# Patient Record
Sex: Female | Born: 1960 | Race: White | Hispanic: No | Marital: Married | Smoking: Never smoker
Health system: Southern US, Community
[De-identification: ages and names within clinical notes are randomized; demographics above are authoritative.]

## PROBLEM LIST (undated history)

## (undated) DIAGNOSIS — E119 Type 2 diabetes mellitus without complications: Secondary | ICD-10-CM

## (undated) DIAGNOSIS — F419 Anxiety disorder, unspecified: Secondary | ICD-10-CM

## (undated) DIAGNOSIS — K222 Esophageal obstruction: Secondary | ICD-10-CM

## (undated) DIAGNOSIS — N200 Calculus of kidney: Secondary | ICD-10-CM

## (undated) DIAGNOSIS — N3289 Other specified disorders of bladder: Secondary | ICD-10-CM

## (undated) DIAGNOSIS — I10 Essential (primary) hypertension: Secondary | ICD-10-CM

## (undated) DIAGNOSIS — J189 Pneumonia, unspecified organism: Secondary | ICD-10-CM

## (undated) DIAGNOSIS — F32A Depression, unspecified: Secondary | ICD-10-CM

## (undated) DIAGNOSIS — K219 Gastro-esophageal reflux disease without esophagitis: Secondary | ICD-10-CM

## (undated) DIAGNOSIS — F329 Major depressive disorder, single episode, unspecified: Secondary | ICD-10-CM

## (undated) DIAGNOSIS — J329 Chronic sinusitis, unspecified: Secondary | ICD-10-CM

## (undated) DIAGNOSIS — E785 Hyperlipidemia, unspecified: Secondary | ICD-10-CM

## (undated) DIAGNOSIS — K635 Polyp of colon: Secondary | ICD-10-CM

## (undated) DIAGNOSIS — K2270 Barrett's esophagus without dysplasia: Secondary | ICD-10-CM

## (undated) HISTORY — PX: CHOLECYSTECTOMY: SHX55

## (undated) HISTORY — DX: Esophageal obstruction: K22.2

## (undated) HISTORY — DX: Gastro-esophageal reflux disease without esophagitis: K21.9

## (undated) HISTORY — DX: Pneumonia, unspecified organism: J18.9

## (undated) HISTORY — DX: Calculus of kidney: N20.0

## (undated) HISTORY — DX: Chronic sinusitis, unspecified: J32.9

## (undated) HISTORY — DX: Polyp of colon: K63.5

## (undated) HISTORY — DX: Other specified disorders of bladder: N32.89

## (undated) HISTORY — DX: Anxiety disorder, unspecified: F41.9

## (undated) HISTORY — PX: APPENDECTOMY: SHX54

## (undated) HISTORY — PX: TONSILLECTOMY: SUR1361

## (undated) HISTORY — DX: Major depressive disorder, single episode, unspecified: F32.9

## (undated) HISTORY — PX: TOTAL VAGINAL HYSTERECTOMY: SHX2548

## (undated) HISTORY — DX: Essential (primary) hypertension: I10

## (undated) HISTORY — PX: NASAL ENDOSCOPY W/ BALLON SINUPLASTY: SHX2065

## (undated) HISTORY — DX: Depression, unspecified: F32.A

## (undated) HISTORY — DX: Barrett's esophagus without dysplasia: K22.70

## (undated) HISTORY — DX: Hyperlipidemia, unspecified: E78.5

## (undated) HISTORY — DX: Type 2 diabetes mellitus without complications: E11.9

---

## 2009-10-11 ENCOUNTER — Encounter: Admission: RE | Admit: 2009-10-11 | Discharge: 2009-11-16 | Payer: Self-pay | Admitting: Orthopedic Surgery

## 2010-09-11 DIAGNOSIS — J189 Pneumonia, unspecified organism: Secondary | ICD-10-CM

## 2010-09-11 HISTORY — DX: Pneumonia, unspecified organism: J18.9

## 2011-09-18 ENCOUNTER — Ambulatory Visit (INDEPENDENT_AMBULATORY_CARE_PROVIDER_SITE_OTHER)
Admission: RE | Admit: 2011-09-18 | Discharge: 2011-09-18 | Disposition: A | Discharge status: Home / Self Care | Payer: 59 | Source: Ambulatory Visit | Attending: Critical Care Medicine | Admitting: Critical Care Medicine

## 2011-09-18 ENCOUNTER — Other Ambulatory Visit: Payer: Self-pay | Admitting: Critical Care Medicine

## 2011-09-18 ENCOUNTER — Ambulatory Visit (INDEPENDENT_AMBULATORY_CARE_PROVIDER_SITE_OTHER): Payer: 59 | Admitting: Critical Care Medicine

## 2011-09-18 ENCOUNTER — Ambulatory Visit (HOSPITAL_BASED_OUTPATIENT_CLINIC_OR_DEPARTMENT_OTHER)
Admission: RE | Admit: 2011-09-18 | Discharge: 2011-09-18 | Disposition: A | Discharge status: Home / Self Care | Payer: 59 | Source: Ambulatory Visit | Attending: Critical Care Medicine | Admitting: Critical Care Medicine

## 2011-09-18 ENCOUNTER — Encounter: Payer: Self-pay | Admitting: Pulmonary Disease

## 2011-09-18 ENCOUNTER — Encounter: Payer: Self-pay | Admitting: Critical Care Medicine

## 2011-09-18 DIAGNOSIS — K2270 Barrett's esophagus without dysplasia: Secondary | ICD-10-CM

## 2011-09-18 DIAGNOSIS — J338 Other polyp of sinus: Secondary | ICD-10-CM

## 2011-09-18 DIAGNOSIS — J329 Chronic sinusitis, unspecified: Secondary | ICD-10-CM

## 2011-09-18 DIAGNOSIS — G43909 Migraine, unspecified, not intractable, without status migrainosus: Secondary | ICD-10-CM | POA: Insufficient documentation

## 2011-09-18 DIAGNOSIS — J45909 Unspecified asthma, uncomplicated: Secondary | ICD-10-CM | POA: Insufficient documentation

## 2011-09-18 DIAGNOSIS — J309 Allergic rhinitis, unspecified: Secondary | ICD-10-CM | POA: Insufficient documentation

## 2011-09-18 DIAGNOSIS — E119 Type 2 diabetes mellitus without complications: Secondary | ICD-10-CM | POA: Insufficient documentation

## 2011-09-18 DIAGNOSIS — J189 Pneumonia, unspecified organism: Secondary | ICD-10-CM | POA: Insufficient documentation

## 2011-09-18 DIAGNOSIS — K219 Gastro-esophageal reflux disease without esophagitis: Secondary | ICD-10-CM | POA: Insufficient documentation

## 2011-09-18 DIAGNOSIS — E785 Hyperlipidemia, unspecified: Secondary | ICD-10-CM

## 2011-09-18 DIAGNOSIS — R0602 Shortness of breath: Secondary | ICD-10-CM

## 2011-09-18 DIAGNOSIS — I10 Essential (primary) hypertension: Secondary | ICD-10-CM

## 2011-09-18 MED ORDER — PREDNISONE 10 MG PO TABS: ORAL_TABLET | ORAL | Status: DC

## 2011-09-18 MED ORDER — DEXLANSOPRAZOLE 60 MG PO CPDR: 60.0000 mg | DELAYED_RELEASE_CAPSULE | Freq: Every day | ORAL | Status: AC

## 2011-09-18 MED ORDER — MOMETASONE FURO-FORMOTEROL FUM 200-5 MCG/ACT IN AERO: 2.0000 | INHALATION_SPRAY | Freq: Two times a day (BID) | RESPIRATORY_TRACT | Status: DC

## 2011-09-18 NOTE — Progress Notes (Signed)
Subjective:    Patient ID: Erin Ramsey, female    DOB: 1961/05/31, 51 y.o.   MRN: [id number]  HPI Comments: Hx of recurrent PNA,,  1/12, 10/12, 12/12.  Alternates lobes.  Symptoms have been : fever, cough prod of green, dyspnea.  Zpak was the last Rx 12/24.  Xray showed LUL infiltrate. Now: more pain in back,  Cough is productive yellow to white to tan.  Pt notes dypsnea with exertion. Notes sinus disease, and pndrip.  Notes sinus pressure.   Asthma dx lifelong.  Worse in winter time.   Notes allergies to trees/pollen/ dust mites ABX: Rocephin inj 10/12 1/12: omniceph 2/12 zpak 10/12 levaquin, pred, zpak 12/12 zpak pred    Shortness of Breath This is a recurrent problem. The current episode started more than 1 year ago. The problem occurs intermittently. The problem has been gradually improving. Associated symptoms include headaches, rhinorrhea, sputum production and wheezing. Pertinent negatives include no abdominal pain, chest pain, claudication, coryza, ear pain, fever, hemoptysis, leg pain, leg swelling, neck pain, orthopnea, PND, rash, sore throat, swollen glands, syncope or vomiting. The symptoms are aggravated by fumes, odors, pollens, exercise, emotional upset, any activity, lying flat, smoke, URIs and weather changes. Associated symptoms comments: Notes severe heartburn, on nexium daily, will breakthrough Will periodically get esophagus stretched, last time 4/12 . Risk factors include smoking (passive smoke exposure, heavy smoking mother died of copd). She has tried beta agonist inhalers and steroid inhalers for the symptoms. The treatment provided mild relief. Her past medical history is significant for allergies, asthma and pneumonia. There is no history of aspirin allergies, bronchiolitis, CAD, chronic lung disease, COPD, DVT, a heart failure, PE or a recent surgery.    Past Medical History  Diagnosis Date  . Diabetes mellitus     type 2  . Hyperlipemia   . Hypertension     . Migraine   . Barrett's esophagus   . Colon polyps   . Esophageal stricture   . Allergic rhinitis   . Asthma   . Kidney stone   . Bladder spasms   . GERD (gastroesophageal reflux disease)      Family History  Problem Relation Age of Onset  . COPD Mother   . Diabetes type II Mother   . Heart failure Mother   . Heart failure Mother   . Brain cancer Father      History   Social History  . Marital Status: Married    Spouse Name: N/A    Number of Children: N/A  . Years of Education: college   Occupational History  . 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 Smoking status: Never Smoker   . Smokeless tobacco: Not on file  . Alcohol Use: No  . Drug Use: No  . Sexually Active: Not on file   Other Topics Concern  . Not on file   Social History Narrative  . No narrative on file     Allergies  Allergen Reactions  . Ceclor (Cefaclor)     rash  . Erythromycin     Upset stomach      No outpatient prescriptions prior to visit.      Review of Systems  Constitutional: Positive for chills, appetite change and fatigue. Negative for fever, diaphoresis, activity change and unexpected weight change.  HENT: Positive for congestion, rhinorrhea, sneezing, mouth sores, voice change, postnasal drip and sinus pressure. Negative for hearing loss, ear pain, nosebleeds,  sore throat, facial swelling, trouble swallowing, neck pain, neck stiffness, dental problem, tinnitus and ear discharge.   Eyes: Positive for visual disturbance. Negative for photophobia, discharge and itching.  Respiratory: Positive for cough, sputum production, shortness of breath and wheezing. Negative for apnea, hemoptysis, choking, chest tightness and stridor.   Cardiovascular: Negative for chest pain, palpitations, orthopnea, claudication, leg swelling, syncope and PND.  Gastrointestinal: Positive for nausea. Negative for vomiting, abdominal pain, constipation, blood in stool  and abdominal distention.       Notes chronic heartburn  Genitourinary: Negative for dysuria, urgency, frequency, hematuria, flank pain, decreased urine volume and difficulty urinating.  Musculoskeletal: Positive for back pain. Negative for myalgias, joint swelling, arthralgias and gait problem.  Skin: Negative for color change, pallor and rash.  Neurological: Positive for weakness and headaches. Negative for dizziness, tremors, seizures, syncope, speech difficulty, light-headedness and numbness.  Hematological: Negative for adenopathy. Does not bruise/bleed easily.  Psychiatric/Behavioral: Positive for sleep disturbance and agitation. Negative for confusion. The patient is not nervous/anxious.        Objective:   Physical Exam  Filed Vitals:   09/18/11 1128  BP: 120/82  Pulse: 102  Temp: 98.3 F (36.8 C)  TempSrc: Oral  Height: 5\' 2"  (1.575 m)  Weight: 159 lb (72.122 kg)  SpO2: 98%    Gen: Pleasant, well-nourished, in no distress,  normal affect  ENT: No lesions,  mouth clear,  oropharynx clear, +++ postnasal drip, nares bilateral purulence  Neck: No JVD, no TMG, no carotid bruits  Lungs: No use of accessory muscles, no dullness to percussion, pseudowheeze  Cardiovascular: RRR, heart sounds normal, no murmur or gallops, no peripheral edema  Abdomen: soft and NT, no HSM,  BS normal  Musculoskeletal: No deformities, no cyanosis or clubbing  Neuro: alert, non focal  Skin: Warm, no lesions or rashes  Dg Chest 2 View  09/18/2011  *RADIOLOGY REPORT*  Clinical Data: Complains of shortness of breath.  CHEST - 2 VIEW  Comparison: None.  Findings: The heart, mediastinal, and hilar contours are within normal limits and stable.  The lungs are well expanded. There is slight peribronchial thickening.  No focal airspace disease, edema, or pleural effusion.  Negative for pneumothorax.  Bony thorax is unremarkable.  Metallic coils in the left abdomen are noted and may reflect prior  aneurysm coiling.  IMPRESSION: Slight peribronchial thickening.  This can be seen in the setting of acute or chronic bronchitis, smoking, or asthma.  Original Report Authenticated By: Britta Mccreedy, M.D.   [facility]  09/18/2011  *RADIOLOGY REPORT*  Clinical Data:  Pneumonia three times past year.  Evaluate for undiagnosed sinusitis.  CT LIMITED SINUSES WITHOUT CONTRAST  Technique:  Multidetector CT images of the paranasal sinuses were obtained in a single plane without contrast.  Comparison:  None.  Findings:  Under developed frontal sinuses are clear.  Minimal partial opacification/mucosal thickening left ethmoid sinus air cell. Clear right ethmoid sinus air cells.  Polypoid opacification anterior inferior aspect of the maxillary sinuses bilaterally.  Infundibulum patent bilaterally.  Mild polypoid opacification left sphenoid sinus.  Right sphenoid sinuses clear.  Contra bullosa left middle turbinate with minimal partial opacification.  Curvature of the nasal septum.  Visualized intracranial structures and orbital structures unremarkable.  IMPRESSION: Under developed frontal sinuses are clear.  Minimal partial opacification/mucosal thickening left ethmoid sinus air cell. Clear right ethmoid sinus air cells.  Polypoid opacification anterior inferior aspect of the maxillary sinuses bilaterally.  Infundibulum patent bilaterally.  Mild polypoid opacification left sphenoid sinus.  Right sphenoid sinuses clear.  Original Report Authenticated By: Fuller Canada, M.D.         Assessment & Plan:

## 2011-09-18 NOTE — Patient Instructions (Addendum)
Stop fish oil Stop Nexium Start Dexilant one daily Stop advair Start Dulera two puff twice daily Prednisone 10mg  Take 4 for two days three for two days two for two days one for two days No further antibiotics for now CT Scan of sinuses to be obtained Chest xray will be obtained Follow reflux diet Lab work today Return High POint two weeks

## 2011-09-19 ENCOUNTER — Telehealth: Payer: Self-pay | Admitting: Critical Care Medicine

## 2011-09-19 LAB — ALLERGY FULL PROFILE
Alternaria Alternata: <0.1 kU/L (ref <0.35)
Aspergillus fumigatus, IgG: <0.1 kU/L (ref <0.35)
Bahia Grass: 0.17 kU/L (ref <0.35)
Bermuda Grass: 0.24 kU/L (ref <0.35)
Cat Dander: 0.1 kU/L (ref <0.35)
Common Ragweed: 0.16 kU/L (ref <0.35)
D. farinae: 2.4 kU/L — ABNORMAL HIGH (ref <0.35)
Dog Dander: 0.23 kU/L (ref <0.35)
Elm IgE: <0.1 kU/L (ref <0.35)
Fescue: 0.69 kU/L — ABNORMAL HIGH (ref <0.35)
G009 Red Top: 1.02 kU/L — ABNORMAL HIGH (ref <0.35)
Goldenrod: <0.1 kU/L (ref <0.35)
Helminthosporium halodes: <0.1 kU/L (ref <0.35)
Lamb's Quarters: <0.1 kU/L (ref <0.35)
Plantain: <0.1 kU/L (ref <0.35)
Sycamore Tree: <0.1 kU/L (ref <0.35)

## 2011-09-19 MED ORDER — AMOXICILLIN-POT CLAVULANATE 875-125 MG PO TABS: 1.0000 | ORAL_TABLET | Freq: Two times a day (BID) | ORAL | Status: AC

## 2011-09-19 NOTE — Telephone Encounter (Signed)
I spoke to the pt re:results.

## 2011-09-19 NOTE — Telephone Encounter (Signed)
I spoke with pt and she states she picked up the Augmentin but is wanting to know what her results showed. Please advise Dr. Delford Field, thanks

## 2011-09-19 NOTE — Assessment & Plan Note (Signed)
Recurrent PNA now resolved d/t microaspiration, recurrent sinusitis , lower airway inflammation, GERD Plan Stop fish oil Stop Nexium Start Dexilant one daily Stop advair Start Dulera two puff twice daily Prednisone 10mg  Take 4 for two days three for two days two for two days one for two days No further antibiotics for now CT Scan of sinuses to be obtained>>>>CT shows pan sinusitis Chest xray will be obtained>>>bronchial thickening demonstrated Follow reflux diet Lab work today Return High POint two weeks

## 2011-09-25 ENCOUNTER — Telehealth: Payer: Self-pay | Admitting: Critical Care Medicine

## 2011-09-25 LAB — HYPERSENSITIVITY PNUEMONITIS PROFILE

## 2011-09-25 MED ORDER — FLUCONAZOLE 100 MG PO TABS: ORAL_TABLET | ORAL | Status: DC

## 2011-09-25 NOTE — Telephone Encounter (Signed)
I spoke with pt and is aware of PW recs. Pt also aware of directions and rx was sent. Nothing further was needed

## 2011-09-25 NOTE — Telephone Encounter (Signed)
Pt aware we will mail a copy of labwork to her home address and address was verified with the patient.  Pt also requesting an RX for Diflucan due to Augmentin use. Pls advise. Allergies  Allergen Reactions  . Ceclor (Cefaclor)     rash  . Erythromycin     Upset stomach

## 2011-09-25 NOTE — Telephone Encounter (Signed)
She has multiple allergies I will review with her on next ov Ok to copy and mail to the pt

## 2011-09-25 NOTE — Telephone Encounter (Signed)
I spoke with pt and she is requesting her lab results from 09/18/11. Please advise Dr. Delford Field, thanks

## 2011-09-25 NOTE — Telephone Encounter (Signed)
Ok for diflucan: 100mg  Take two once then one daily until gone #6 

## 2011-10-03 ENCOUNTER — Telehealth: Payer: Self-pay | Admitting: Critical Care Medicine

## 2011-10-03 NOTE — Telephone Encounter (Signed)
lmomtcb x1 

## 2011-10-04 MED ORDER — PREDNISONE 10 MG PO TABS: ORAL_TABLET | ORAL | Status: DC

## 2011-10-04 MED ORDER — CEFDINIR 300 MG PO CAPS: 600.0000 mg | ORAL_CAPSULE | Freq: Every day | ORAL | Status: AC

## 2011-10-04 NOTE — Telephone Encounter (Signed)
I spoke with pt and advised her of PW recs. She was fine with these 2 rx's being called in for her. [facility] into [facility] [place] per pt request. Nothing further was needed

## 2011-10-04 NOTE — Telephone Encounter (Signed)
lmomtcb x 2  

## 2011-10-04 NOTE — Telephone Encounter (Signed)
Recycle :  Prednisone 10mg  Take 4 for two days three for two days two for two days one for two days  #20  And Cefdinir 600mg  daily x 7days

## 2011-10-04 NOTE — Telephone Encounter (Signed)
lmomtcb x1 to advise pt of pw recs

## 2011-10-04 NOTE — Telephone Encounter (Signed)
I spoke with the pt and she states that she finished the antibiotic on 09-28-11 and prednisone on 09-24-11 and she did have relief in her sinuses and felt much better.  She states now x 5 days she has had increased sinus pressure and pain and congestion, and is blowing yellow from her nose. She states she has an appt with Dr. Delford Field tomorrow afternoon, but was unsure if she should wait until tomorrow for treatment. Please advise. Carron Curie, CMA Allergies  Allergen Reactions  . Ceclor (Cefaclor)     rash  . Erythromycin     Upset stomach

## 2011-10-04 NOTE — Telephone Encounter (Signed)
Pt returned triage's call & can be reached at [phone].  Erin Ramsey

## 2011-10-05 ENCOUNTER — Encounter: Payer: Self-pay | Admitting: Critical Care Medicine

## 2011-10-05 ENCOUNTER — Ambulatory Visit (INDEPENDENT_AMBULATORY_CARE_PROVIDER_SITE_OTHER): Payer: 59 | Admitting: Critical Care Medicine

## 2011-10-05 VITALS — BP 106/78 | HR 114 | Temp 98.1°F | Ht 63.0 in | Wt 160.0 lb

## 2011-10-05 DIAGNOSIS — J323 Chronic sphenoidal sinusitis: Secondary | ICD-10-CM

## 2011-10-05 DIAGNOSIS — J45909 Unspecified asthma, uncomplicated: Secondary | ICD-10-CM

## 2011-10-05 DIAGNOSIS — J309 Allergic rhinitis, unspecified: Secondary | ICD-10-CM

## 2011-10-05 DIAGNOSIS — J189 Pneumonia, unspecified organism: Secondary | ICD-10-CM

## 2011-10-05 NOTE — Progress Notes (Signed)
Subjective:    Patient ID: Erin Ramsey, female    DOB: 04/25/1961, 51 y.o.   MRN: [id number]  HPI 51 y.o.F 10/05/2011 Pt is somewhat better but notes everytime she tapers off pred and Abx she reflares with sinus pressure and drainage. Pt denies any significant sore throat, nasal congestion or excess secretions, fever, chills, sweats, unintended weight loss, pleurtic or exertional chest pain, orthopnea PND, or leg swelling Pt denies any increase in rescue therapy over baseline, denies waking up needing it or having any early am or nocturnal exacerbations of coughing/wheezing/or dyspnea. Pt also denies any obvious fluctuation in symptoms with  weather or environmental change or other alleviating or aggravating factors   Past Medical History  Diagnosis Date  . Diabetes mellitus     type 2  . Hyperlipemia   . Hypertension   . Migraine   . Barrett's esophagus   . Colon polyps   . Esophageal stricture   . Allergic rhinitis   . Asthma   . Kidney stone   . Bladder spasms   . GERD (gastroesophageal reflux disease)      Family History  Problem Relation Age of Onset  . COPD Mother   . Diabetes type II Mother   . Heart failure Mother   . Heart failure Mother   . Brain cancer Father      History   Social History  . Marital Status: Married    Spouse Name: N/A    Number of Children: N/A  . Years of Education: college   Occupational History  . 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 Smoking status: Never Smoker   . Smokeless tobacco: Never Used  . Alcohol Use: No  . Drug Use: No  . Sexually Active: Not on file   Other Topics Concern  . Not on file   Social History Narrative  . No narrative on file     Allergies  Allergen Reactions  . Ceclor (Cefaclor)     rash  . Erythromycin     Upset stomach      Outpatient Prescriptions Prior to Visit  Medication Sig Dispense Refill  . B Complex-C-Min-Fe-FA (HEMATINIC PLUS VIT/MINERALS)  TABS Daily.      Marland Kitchen buPROPion (WELLBUTRIN XL) 300 MG 24 hr tablet Daily.      . candesartan-hydrochlorothiazide (ATACAND HCT) 16-12.5 MG per tablet Take 1 tablet by mouth daily.        . cefdinir (OMNICEF) 300 MG capsule Take 2 capsules (600 mg total) by mouth daily.  14 capsule  0  . dexlansoprazole (DEXILANT) 60 MG capsule Take 1 capsule (60 mg total) by mouth daily.  30 capsule  6  . IRON PO Take by mouth daily. Dosage unknown       . levalbuterol (XOPENEX) 1.25 MG/3ML nebulizer solution as needed.      . metFORMIN (GLUCOPHAGE) 1000 MG tablet Take 1,000 mg by mouth 2 (two) times daily with a meal.        . Mometasone Furo-Formoterol Fum (DULERA) 200-5 MCG/ACT AERO Inhale 2 puffs into the lungs 2 (two) times daily.  1 Inhaler  6  . montelukast (SINGULAIR) 10 MG tablet Daily.      . Multiple Vitamin (MULTIVITAMIN) tablet Take 1 tablet by mouth daily.        . predniSONE (DELTASONE) 10 MG tablet Take 4 for two days three for two days two for two days one for two days  20 tablet  0  . VENTOLIN HFA 108 (90 BASE) MCG/ACT inhaler as needed.      . Vitamin D, Ergocalciferol, (DRISDOL) 50000 UNITS CAPS Take 50,000 Units by mouth every 7 (seven) days.        . fluconazole (DIFLUCAN) 100 MG tablet Take 2 tablets once and then 1 daily until gone  6 tablet  0  . Loratadine (CLARITIN PO) Take by mouth daily.        . ranitidine (ZANTAC) 15 MG/ML syrup Take by mouth 2 (two) times daily as needed.           Review of Systems 11pt Ros taken and is neg except as per HPI    Objective:   Physical Exam  Filed Vitals:   10/05/11 1636  BP: 106/78  Pulse: 114  Temp: 98.1 F (36.7 C)  TempSrc: Oral  Height: 5\' 3"  (1.6 m)  Weight: 160 lb (72.576 kg)  SpO2: 97%    Gen: Pleasant, well-nourished, in no distress,  normal affect  ENT: No lesions,  mouth clear,  oropharynx clear, ++postnasal drip, mild nasal purulence, severe turbinate edema  Neck: No JVD, no TMG, no carotid bruits  Lungs: No use of  accessory muscles, no dullness to percussion, clear without rales or rhonchi  Cardiovascular: RRR, heart sounds normal, no murmur or gallops, no peripheral edema  Abdomen: soft and NT, no HSM,  BS normal  Musculoskeletal: No deformities, no cyanosis or clubbing  Neuro: alert, non focal  Skin: Warm, no lesions or rashes  No results found.   1/13 CT Sinus IMPRESSION: Under developed frontal sinuses are clear.  Minimal partial opacification/mucosal thickening left ethmoid sinus air cell. Clear right ethmoid sinus air cells.  Polypoid opacification anterior inferior aspect of the maxillary sinuses bilaterally.  Infundibulum patent bilaterally.  Mild polypoid opacification left sphenoid sinus.  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 Needs surgical approach Plan Finish prednisone and antibiotic An ENT referral to Dr Richardson Landry will be made No other medication changes Return 2 months   Recurrent pneumonia Recurrent PNA on the basis of sinusitis recurrent Plan Rx sinus disease  ENT referral    Updated Medication List Outpatient Encounter Prescriptions as of 10/05/2011  Medication Sig Dispense Refill  . ACCU-CHEK AVIVA PLUS test strip Use as directed      . B Complex-C-Min-Fe-FA (HEMATINIC PLUS VIT/MINERALS) TABS Daily.      Marland Kitchen buPROPion (WELLBUTRIN XL) 300 MG 24 hr tablet Daily.      . candesartan-hydrochlorothiazide (ATACAND HCT) 16-12.5 MG per tablet Take 1 tablet by mouth daily.        . cefdinir (OMNICEF) 300 MG capsule Take 2 capsules (600 mg total) by mouth daily.  14 capsule  0  . dexlansoprazole (DEXILANT) 60 MG capsule Take 1 capsule (60 mg total) by mouth daily.  30 capsule  6  . IRON PO Take by mouth daily. Dosage unknown       . levalbuterol (XOPENEX) 1.25 MG/3ML nebulizer solution as needed.      . loratadine (CLARITIN) 10 MG tablet Take 10 mg by mouth daily.       . metFORMIN (GLUCOPHAGE) 1000 MG tablet Take 1,000 mg by mouth 2 (two) times daily with a meal.        . Mometasone Furo-Formoterol Fum (DULERA) 200-5 MCG/ACT AERO Inhale 2 puffs into the lungs 2 (two) times daily.  1 Inhaler  6  . montelukast (SINGULAIR) 10 MG tablet Daily.      . Multiple Vitamin (MULTIVITAMIN) tablet Take 1 tablet by mouth daily.        . predniSONE (DELTASONE) 10 MG tablet Take 4 for two days three for two days two for two days one for two days  20 tablet  0  . PRESCRIPTION MEDICATION GI Cocktail with Zantac, benadryl, hyoscamine, sodium bicarbonate - 1 tablespoon twice daily as needed      . VENTOLIN HFA 108 (90 BASE) MCG/ACT inhaler as needed.      . Vitamin D, Ergocalciferol, (DRISDOL) 50000 UNITS CAPS Take 50,000 Units by mouth every 7 (seven) days.        Marland Kitchen DISCONTD: fluconazole (DIFLUCAN) 100 MG tablet Take 2 tablets once and then 1 daily until gone  6 tablet  0  . DISCONTD: Loratadine (CLARITIN PO) Take by mouth daily.        Marland Kitchen DISCONTD: ranitidine (ZANTAC) 15 MG/ML syrup Take by mouth 2 (two) times daily as needed.

## 2011-10-05 NOTE — Patient Instructions (Signed)
Finish prednisone and antibiotic Pick up a CD disk of your CT of sinuses downstairs on the way home to take to ENT referral An ENT referral to Dr Richardson Landry will be made No other medication changes Return 2 months

## 2011-10-06 NOTE — Assessment & Plan Note (Signed)
Chronic recurrent sphenoidal/maxillary/ethmoid sinusitis  ? Needs surgical approach Plan Finish prednisone and antibiotic An ENT referral to Dr Richardson Landry will be made No other medication changes Return 2 months

## 2011-10-06 NOTE — Assessment & Plan Note (Signed)
Recurrent PNA on the basis of sinusitis recurrent Plan Rx sinus disease  ENT referral

## 2011-10-09 ENCOUNTER — Telehealth: Payer: Self-pay | Admitting: *Deleted

## 2011-10-09 MED ORDER — FLUCONAZOLE 100 MG PO TABS: ORAL_TABLET | ORAL | Status: AC

## 2011-10-09 NOTE — Telephone Encounter (Signed)
i am ok with that

## 2011-10-09 NOTE — Telephone Encounter (Signed)
Spoke with pt- it was diflucan that was requested. She states that PW called this in for her before. Rx was sent to pharm.

## 2011-10-09 NOTE — Telephone Encounter (Signed)
Spoke with patient this am who says after being on abx, has developed a yeast infection. Would like to know if Dr Delford Field could call in some Mon[facility] to [facility] [place], [place]

## 2011-10-09 NOTE — Telephone Encounter (Signed)
Crystal can you call this in?

## 2011-10-10 ENCOUNTER — Encounter: Payer: Self-pay | Admitting: Critical Care Medicine

## 2011-10-10 ENCOUNTER — Ambulatory Visit (INDEPENDENT_AMBULATORY_CARE_PROVIDER_SITE_OTHER): Payer: 59 | Admitting: Critical Care Medicine

## 2011-10-10 DIAGNOSIS — J323 Chronic sphenoidal sinusitis: Secondary | ICD-10-CM

## 2011-10-10 DIAGNOSIS — J45909 Unspecified asthma, uncomplicated: Secondary | ICD-10-CM

## 2011-10-10 MED ORDER — LEVOFLOXACIN 500 MG PO TABS: 500.0000 mg | ORAL_TABLET | Freq: Every day | ORAL | Status: AC

## 2011-10-10 MED ORDER — AEROCHAMBER MV MISC: Status: AC

## 2011-10-10 MED ORDER — CICLESONIDE 37 MCG/ACT NA AERS: 1.0000 | INHALATION_SPRAY | Freq: Every day | NASAL | Status: DC

## 2011-10-10 NOTE — Progress Notes (Signed)
Subjective:    Patient ID: Erin Ramsey, female    DOB: November 10, 1960, 51 y.o.   MRN: [id number]  HPI  51 y.o.F 1/24 Pt is somewhat better but notes everytime she tapers off pred and Abx she reflares with sinus pressure and drainage. Pt denies any significant sore throat, nasal congestion or excess secretions, fever, chills, sweats, unintended weight loss, pleurtic or exertional chest pain, orthopnea PND, or leg swelling Pt denies any increase in rescue therapy over baseline, denies waking up needing it or having any early am or nocturnal exacerbations of coughing/wheezing/or dyspnea. Pt also denies any obvious fluctuation in symptoms with  weather or environmental change or other alleviating or aggravating factors  1/29 Was ok until the weekend, then worse over the weekend.  Throat hurt , mucus coming out. Then 1 day ago, is ok, then in am wheezing and coughing and headache. Has one more cefinir back and two more pred left  Past Medical History  Diagnosis Date  . Diabetes mellitus     type 2  . Hyperlipemia   . Hypertension   . Migraine   . Barrett's esophagus   . Colon polyps   . Esophageal stricture   . Allergic rhinitis   . Asthma   . Kidney stone   . Bladder spasms   . GERD (gastroesophageal reflux disease)      Family History  Problem Relation Age of Onset  . COPD Mother   . Diabetes type II Mother   . Heart failure Mother   . Heart failure Mother   . Brain cancer Father      History   Social History  . Marital Status: Married    Spouse Name: N/A    Number of Children: N/A  . Years of Education: college   Occupational History  . 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 Smoking status: Never Smoker   . Smokeless tobacco: Never Used  . Alcohol Use: No  . Drug Use: No  . Sexually Active: Not on file   Other Topics Concern  . Not on file   Social History Narrative  . No narrative on file     Allergies  Allergen  Reactions  . Ceclor (Cefaclor)     rash  . Erythromycin     Upset stomach      Outpatient Prescriptions Prior to Visit  Medication Sig Dispense Refill  . ACCU-CHEK AVIVA PLUS test strip Use as directed      . B Complex-C-Min-Fe-FA (HEMATINIC PLUS VIT/MINERALS) TABS Daily.      Marland Kitchen buPROPion (WELLBUTRIN XL) 300 MG 24 hr tablet Daily.      . candesartan-hydrochlorothiazide (ATACAND HCT) 16-12.5 MG per tablet Take 1 tablet by mouth daily.        . cefdinir (OMNICEF) 300 MG capsule Take 2 capsules (600 mg total) by mouth daily.  14 capsule  0  . dexlansoprazole (DEXILANT) 60 MG capsule Take 1 capsule (60 mg total) by mouth daily.  30 capsule  6  . IRON PO Take by mouth daily. Dosage unknown       . levalbuterol (XOPENEX) 1.25 MG/3ML nebulizer solution as needed.      . metFORMIN (GLUCOPHAGE) 1000 MG tablet Take 1,000 mg by mouth 2 (two) times daily with a meal.        . Mometasone Furo-Formoterol Fum (DULERA) 200-5 MCG/ACT AERO Inhale 2 puffs into the lungs 2 (two) times daily.  1  Inhaler  6  . montelukast (SINGULAIR) 10 MG tablet Daily.      . Multiple Vitamin (MULTIVITAMIN) tablet Take 1 tablet by mouth daily.        . predniSONE (DELTASONE) 10 MG tablet Take 4 for two days three for two days two for two days one for two days  20 tablet  0  . PRESCRIPTION MEDICATION GI Cocktail with Zantac, benadryl, hyoscamine, sodium bicarbonate - 1 tablespoon twice daily as needed      . VENTOLIN HFA 108 (90 BASE) MCG/ACT inhaler as needed.      . Vitamin D, Ergocalciferol, (DRISDOL) 50000 UNITS CAPS Take 50,000 Units by mouth every 7 (seven) days.        Marland Kitchen loratadine (CLARITIN) 10 MG tablet Take 10 mg by mouth daily.      . fluconazole (DIFLUCAN) 100 MG tablet Take 2 today, then one daily until gone  6 tablet  0      Review of Systems  11pt Ros taken and is neg except as per HPI    Objective:   Physical Exam   Filed Vitals:   10/10/11 1625  BP: 120/84  Pulse: 91  Temp: 98.3 F (36.8 C)    TempSrc: Oral  Height: 5\' 3"  (1.6 m)  Weight: 161 lb 6.4 oz (73.211 kg)  SpO2: 98%    Gen: Pleasant, well-nourished, in no distress,  normal affect  ENT: No lesions,  mouth clear,  oropharynx clear, ++postnasal drip, mild nasal purulence, severe turbinate edema  Neck: No JVD, no TMG, no carotid bruits  Lungs: No use of accessory muscles, no dullness to percussion, clear without rales or rhonchi  Cardiovascular: RRR, heart sounds normal, no murmur or gallops, no peripheral edema  Abdomen: soft and NT, no HSM,  BS normal  Musculoskeletal: No deformities, no cyanosis or clubbing  Neuro: alert, non focal  Skin: Warm, no lesions or rashes  No results found.   1/13 CT Sinus IMPRESSION: Under developed frontal sinuses are clear.  Minimal partial opacification/mucosal thickening left ethmoid sinus air cell. Clear right ethmoid sinus air cells.  Polypoid opacification anterior inferior aspect of the maxillary sinuses bilaterally.  Infundibulum patent bilaterally.  Mild polypoid opacification left sphenoid sinus.  Right sphenoid sinuses clear    Assessment & Plan:   Asthma Severe persistent asthma driven by chronic sinus disease and severe atopic features with multiple positive allergies. Plan Continued inhaled medications as prescribed Finish current course of prednisone Extend antibiotics additional week with Levaquin 500 milligrams daily  Sinusitis chronic, sphenoidal Persistent chronic sphenoidal sinusitis with ongoing recurrent asthma flare, no evidence of active pneumonia at this time Plan Begin nasal steroid Continue sinus rinse Patient keep scheduled appointment with ENT in one week Extend antibiotics with  Levaquin for an additional week Stop Claritin for now     Updated Medication List Outpatient Encounter Prescriptions as of 10/10/2011  Medication Sig Dispense Refill  . ACCU-CHEK AVIVA PLUS test strip Use as directed      . B Complex-C-Min-Fe-FA (HEMATINIC  PLUS VIT/MINERALS) TABS Daily.      Marland Kitchen buPROPion (WELLBUTRIN XL) 300 MG 24 hr tablet Daily.      . candesartan-hydrochlorothiazide (ATACAND HCT) 16-12.5 MG per tablet Take 1 tablet by mouth daily.        . cefdinir (OMNICEF) 300 MG capsule Take 2 capsules (600 mg total) by mouth daily.  14 capsule  0  . dexlansoprazole (DEXILANT) 60 MG capsule Take 1 capsule (60 mg total) by mouth  daily.  30 capsule  6  . IRON PO Take by mouth daily. Dosage unknown       . levalbuterol (XOPENEX) 1.25 MG/3ML nebulizer solution as needed.      . metFORMIN (GLUCOPHAGE) 1000 MG tablet Take 1,000 mg by mouth 2 (two) times daily with a meal.        . Mometasone Furo-Formoterol Fum (DULERA) 200-5 MCG/ACT AERO Inhale 2 puffs into the lungs 2 (two) times daily.  1 Inhaler  6  . montelukast (SINGULAIR) 10 MG tablet Daily.      . Multiple Vitamin (MULTIVITAMIN) tablet Take 1 tablet by mouth daily.        . predniSONE (DELTASONE) 10 MG tablet Take 4 for two days three for two days two for two days one for two days  20 tablet  0  . PRESCRIPTION MEDICATION GI Cocktail with Zantac, benadryl, hyoscamine, sodium bicarbonate - 1 tablespoon twice daily as needed      . VENTOLIN HFA 108 (90 BASE) MCG/ACT inhaler as needed.      . Vitamin D, Ergocalciferol, (DRISDOL) 50000 UNITS CAPS Take 50,000 Units by mouth every 7 (seven) days.        Marland Kitchen DISCONTD: loratadine (CLARITIN) 10 MG tablet Take 10 mg by mouth daily.      . Ciclesonide (ZETONNA) 37 MCG/ACT AERS Place 1 puff into the nose daily.  1 Inhaler  6  . fluconazole (DIFLUCAN) 100 MG tablet Take 2 today, then one daily until gone  6 tablet  0  . levofloxacin (LEVAQUIN) 500 MG tablet Take 1 tablet (500 mg total) by mouth daily.  7 tablet  0  . Spacer/Aero-Holding Chambers (AEROCHAMBER MV) inhaler Use as instructed  1 each  0

## 2011-10-10 NOTE — Patient Instructions (Addendum)
Take generic levaquin for 7days on top of last of cefdinir Finish prednisone, no more prednisone after current course completed Stay on nasal rinse saline Start Zetonna nasal spray one puff daily each nostril Stop Claritin for now USe spacer with [facility]  Keep ENT appt next week

## 2011-10-11 ENCOUNTER — Encounter: Payer: Self-pay | Admitting: Critical Care Medicine

## 2011-10-11 NOTE — Assessment & Plan Note (Signed)
Severe persistent asthma driven by chronic sinus disease and severe atopic features with multiple positive allergies. Plan Continued inhaled medications as prescribed Finish current course of prednisone Extend antibiotics additional week with Levaquin 500 milligrams daily

## 2011-10-11 NOTE — Assessment & Plan Note (Addendum)
Persistent chronic sphenoidal sinusitis with ongoing recurrent asthma flare, no evidence of active pneumonia at this time Plan Begin nasal steroid Continue sinus rinse Patient keep scheduled appointment with ENT in one week Extend antibiotics with  Levaquin for an additional week Stop Claritin for now

## 2011-11-23 ENCOUNTER — Ambulatory Visit (HOSPITAL_BASED_OUTPATIENT_CLINIC_OR_DEPARTMENT_OTHER)
Admission: RE | Admit: 2011-11-23 | Discharge: 2011-11-23 | Disposition: A | Discharge status: Home / Self Care | Payer: 59 | Source: Ambulatory Visit | Attending: Family Medicine | Admitting: Family Medicine

## 2011-11-23 ENCOUNTER — Other Ambulatory Visit (HOSPITAL_BASED_OUTPATIENT_CLINIC_OR_DEPARTMENT_OTHER): Payer: Self-pay | Admitting: Family Medicine

## 2011-11-23 DIAGNOSIS — J3489 Other specified disorders of nose and nasal sinuses: Secondary | ICD-10-CM

## 2011-11-23 DIAGNOSIS — J329 Chronic sinusitis, unspecified: Secondary | ICD-10-CM

## 2011-11-23 DIAGNOSIS — R51 Headache: Secondary | ICD-10-CM

## 2011-12-04 ENCOUNTER — Ambulatory Visit: Payer: Self-pay | Admitting: Critical Care Medicine

## 2012-06-05 ENCOUNTER — Ambulatory Visit (HOSPITAL_BASED_OUTPATIENT_CLINIC_OR_DEPARTMENT_OTHER)
Admission: RE | Admit: 2012-06-05 | Discharge: 2012-06-05 | Disposition: A | Discharge status: Home / Self Care | Payer: 59 | Source: Ambulatory Visit | Attending: Family Medicine | Admitting: Family Medicine

## 2012-06-05 ENCOUNTER — Other Ambulatory Visit (HOSPITAL_BASED_OUTPATIENT_CLINIC_OR_DEPARTMENT_OTHER): Payer: Self-pay | Admitting: Family Medicine

## 2012-06-05 DIAGNOSIS — I10 Essential (primary) hypertension: Secondary | ICD-10-CM | POA: Insufficient documentation

## 2012-06-05 DIAGNOSIS — R0989 Other specified symptoms and signs involving the circulatory and respiratory systems: Secondary | ICD-10-CM | POA: Insufficient documentation

## 2012-06-05 DIAGNOSIS — J209 Acute bronchitis, unspecified: Secondary | ICD-10-CM

## 2012-06-05 DIAGNOSIS — R05 Cough: Secondary | ICD-10-CM | POA: Insufficient documentation

## 2012-06-05 DIAGNOSIS — R059 Cough, unspecified: Secondary | ICD-10-CM | POA: Insufficient documentation

## 2012-06-05 DIAGNOSIS — E119 Type 2 diabetes mellitus without complications: Secondary | ICD-10-CM | POA: Insufficient documentation

## 2012-12-12 ENCOUNTER — Telehealth: Payer: Self-pay | Admitting: *Deleted

## 2012-12-12 NOTE — Telephone Encounter (Signed)
She is due for labs (tell her we no longer do the A1c test in the office visit) she needs to come one morning fasting and then you can schedule her appt for follow up.  Thanks PG

## 2012-12-12 NOTE — Telephone Encounter (Signed)
PT WANTED TO KNOW IF SHE IS DUE TO COME IN OR FOR LAB WORK. JUST LET ME KNOW (Erin Ramsey) AND I WILL CALL HER BACK TO SCHED. WHATEVER APPTS. SHE NEEDS. CALL PT BACK AT  [phone]  [facility]

## 2012-12-16 ENCOUNTER — Other Ambulatory Visit: Payer: Self-pay

## 2012-12-16 ENCOUNTER — Telehealth: Payer: Self-pay | Admitting: *Deleted

## 2012-12-16 DIAGNOSIS — M109 Gout, unspecified: Secondary | ICD-10-CM

## 2012-12-16 MED ORDER — INDOMETHACIN ER 75 MG PO CPCR: 75.0000 mg | ORAL_CAPSULE | Freq: Two times a day (BID) | ORAL | Status: DC

## 2012-12-16 NOTE — Telephone Encounter (Signed)
PT NEEDS MEDS ABOUT SWELLING IN JOINTS (INDOMETHAZINE). PT COMING IN FOR LABS THIS WEEK. PHARMACY [facility].

## 2012-12-16 NOTE — Telephone Encounter (Signed)
Dr Alberteen Sam has ordered an Uric Acid level check for her future order. PG

## 2012-12-17 ENCOUNTER — Other Ambulatory Visit: Payer: Self-pay | Admitting: Family Medicine

## 2012-12-17 ENCOUNTER — Other Ambulatory Visit: Payer: 59 | Admitting: *Deleted

## 2012-12-17 DIAGNOSIS — E785 Hyperlipidemia, unspecified: Secondary | ICD-10-CM

## 2012-12-17 DIAGNOSIS — M79609 Pain in unspecified limb: Secondary | ICD-10-CM

## 2012-12-17 DIAGNOSIS — IMO0001 Reserved for inherently not codable concepts without codable children: Secondary | ICD-10-CM

## 2012-12-17 LAB — COMPLETE METABOLIC PANEL WITH GFR
ALT: 19 U/L (ref 0–35)
AST: 14 U/L (ref 0–37)
Albumin: 3.8 g/dL (ref 3.5–5.2)
Alkaline Phosphatase: 72 U/L (ref 39–117)
BUN: 15 mg/dL (ref 6–23)
CO2: 24 mEq/L (ref 19–32)
Calcium: 8.9 mg/dL (ref 8.4–10.5)
Chloride: 103 mEq/L (ref 96–112)
Creat: 0.6 mg/dL (ref 0.50–1.10)
GFR, Est African American: >89 mL/min
GFR, Est Non African American: >89 mL/min
Glucose, Bld: 142 mg/dL — ABNORMAL HIGH (ref 70–99)
Potassium: 4.1 mEq/L (ref 3.5–5.3)
Sodium: 141 mEq/L (ref 135–145)
Total Bilirubin: 0.4 mg/dL (ref 0.3–1.2)
Total Protein: 5.8 g/dL — ABNORMAL LOW (ref 6.0–8.3)

## 2012-12-17 LAB — HEMOGLOBIN A1C
Hgb A1c MFr Bld: 6.5 % — ABNORMAL HIGH (ref <5.7)
Mean Plasma Glucose: 140 mg/dL — ABNORMAL HIGH (ref <117)

## 2012-12-17 LAB — LIPID PANEL
Cholesterol: 179 mg/dL (ref 0–200)
HDL: 51 mg/dL (ref >39)
LDL Cholesterol: 101 mg/dL — ABNORMAL HIGH (ref 0–99)
Total CHOL/HDL Ratio: 3.5 Ratio
Triglycerides: 133 mg/dL (ref <150)
VLDL: 27 mg/dL (ref 0–40)

## 2012-12-17 LAB — URIC ACID: Uric Acid, Serum: 5.4 mg/dL (ref 2.4–7.0)

## 2012-12-23 ENCOUNTER — Encounter: Payer: Self-pay | Admitting: Family Medicine

## 2012-12-23 ENCOUNTER — Ambulatory Visit (INDEPENDENT_AMBULATORY_CARE_PROVIDER_SITE_OTHER): Payer: 59 | Admitting: Family Medicine

## 2012-12-23 VITALS — BP 118/77 | HR 79 | Ht 61.5 in | Wt 171.0 lb

## 2012-12-23 DIAGNOSIS — M19049 Primary osteoarthritis, unspecified hand: Secondary | ICD-10-CM

## 2012-12-23 DIAGNOSIS — J45909 Unspecified asthma, uncomplicated: Secondary | ICD-10-CM

## 2012-12-23 DIAGNOSIS — M19041 Primary osteoarthritis, right hand: Secondary | ICD-10-CM

## 2012-12-23 DIAGNOSIS — E785 Hyperlipidemia, unspecified: Secondary | ICD-10-CM

## 2012-12-23 DIAGNOSIS — IMO0001 Reserved for inherently not codable concepts without codable children: Secondary | ICD-10-CM

## 2012-12-23 MED ORDER — AMOXICILLIN 875 MG PO TABS: 875.0000 mg | ORAL_TABLET | Freq: Two times a day (BID) | ORAL | Status: DC

## 2012-12-23 MED ORDER — DAPAGLIFLOZIN PROPANEDIOL 10 MG PO TABS: 1.0000 | ORAL_TABLET | Freq: Every day | ORAL | Status: DC

## 2012-12-23 MED ORDER — AMOXICILLIN 875 MG PO TABS: ORAL_TABLET | ORAL | Status: DC

## 2012-12-23 MED ORDER — OMEGA-3-ACID ETHYL ESTERS 1 G PO CAPS: 2.0000 g | ORAL_CAPSULE | Freq: Two times a day (BID) | ORAL | Status: DC

## 2012-12-23 MED ORDER — ROSUVASTATIN CALCIUM 20 MG PO TABS: 20.0000 mg | ORAL_TABLET | Freq: Every day | ORAL | Status: DC

## 2012-12-23 NOTE — Progress Notes (Signed)
Subjective:     Patient ID: Erin Ramsey, female   DOB: 1961-01-05, 52 y.o.   MRN: [id number]  HPI Erin Ramsey is here today for several issues. She needs to go over her lab results and she needs to have several of her medications refilled.    1)  Hand/Wrist Pain:  Her biggest complaint today is pain in her right hand/wrist.  She called last week wanting to have her Indomethacin refilled. She thought that this pain was due to gout.  Her pain has improved since she has been on the Indomethacin.      2)  Type II DM:  Her sugars seem to be increasing.  3)  Allergies/Asthma:  She is having worsening allergy symptoms and has been having increased chest tightness.  She has been using her Albuterol more frequently over the past week.    Review of Systems  Constitutional: Negative for fever and chills.  HENT: Positive for congestion, rhinorrhea, sneezing and postnasal drip.   Eyes: Positive for itching.  Respiratory: Positive for chest tightness and shortness of breath. Negative for wheezing.   Cardiovascular: Negative for palpitations.  Musculoskeletal: Positive for joint swelling and arthralgias.  Psychiatric/Behavioral: The patient is not nervous/anxious.        Objective:   Physical Exam  Constitutional: She is oriented to person, place, and time. She appears well-nourished. No distress.  HENT:  Head: Normocephalic.  Nose: Mucosal edema present.  Mouth/Throat: Oropharynx is clear and moist.  Eyes: Conjunctivae are normal.  Neck: No thyromegaly present.  Cardiovascular: Normal rate, regular rhythm and normal heart sounds.   Pulmonary/Chest: Effort normal and breath sounds normal. She has no wheezes.  Musculoskeletal:       Right hand: She exhibits tenderness and swelling. Normal strength noted.  Lymphadenopathy:    She has no cervical adenopathy.  Neurological: She is alert and oriented to person, place, and time.  Skin: No rash noted.  Psychiatric: She has a normal mood and affect. Her  behavior is normal. Judgment and thought content normal.       Assessment:     Type II Diabetes  Osteoarthritis Hyperlipidemia  Allergic Rhinitis Asthma       Plan:     1)  Her A1c is elevated at 6.5%.  She was given samples and a prescription for Farxiga to try and get her A1c down to < 5.7%.    2)  Her uric acid level was normal so the hand pain is most likely not gout but osteoarthritis.  She was given a refill for Indomethacin since it helps with her pain. She is going to try Lovaza to help decrease her inflammation.    3)  Since she is a diabetic, she needs to be on a statin so she'll get back on a low dosage of Crestor (5 mg)   4)  She was given a prescription for [facility].   5)  She is feeling like she did last fall when she felt like she had a lot of inflammation. She was treated with Amoxil for 6 weeks and says that she felt much better. If she does not feel better with the Lovaza, she will take amoxicillin again.

## 2012-12-23 NOTE — Patient Instructions (Addendum)
1 

## 2012-12-24 ENCOUNTER — Encounter: Payer: Self-pay | Admitting: Family Medicine

## 2012-12-24 DIAGNOSIS — E785 Hyperlipidemia, unspecified: Secondary | ICD-10-CM | POA: Insufficient documentation

## 2012-12-24 DIAGNOSIS — J45909 Unspecified asthma, uncomplicated: Secondary | ICD-10-CM | POA: Insufficient documentation

## 2012-12-24 DIAGNOSIS — M19049 Primary osteoarthritis, unspecified hand: Secondary | ICD-10-CM | POA: Insufficient documentation

## 2012-12-24 DIAGNOSIS — IMO0001 Reserved for inherently not codable concepts without codable children: Secondary | ICD-10-CM | POA: Insufficient documentation

## 2013-01-09 ENCOUNTER — Telehealth: Payer: Self-pay | Admitting: *Deleted

## 2013-01-09 MED ORDER — ESCITALOPRAM OXALATE 5 MG PO TABS: 5.0000 mg | ORAL_TABLET | Freq: Every day | ORAL | Status: DC

## 2013-01-09 NOTE — Telephone Encounter (Signed)
Dr Audelia Hives.  Analis wants her Jerilynn Som and since it's inactivated I preferred not to refill it. Would you refill this?  She also wants Dorene Grebe to be on Loestrin rather than the yaz you gave her.  Thanks PG

## 2013-01-09 NOTE — Telephone Encounter (Signed)
PT CALLED AND WAS IN A FEW WEEKS AND PT FORGOT TO GET A REQUEST FOR RX'S IS IT POSSIBLE TO GET THOSE REFILLED-  LEXIPRO 5MG  TESSALON PERLES  AND FOR HER DAUGHTER NATALIE RX WAS FOR - YAZ AND MOM DOES NOT WANT HER ON THAT RX  CAN WE DO A RX FOR- MOM REQUEST LOESTRIN?- THE SAME RX BIRTH CONTROL HALEY (THE OTHER DAUGHTER IS ON)  PLEASE ADVISE.  THANK YOU

## 2013-01-10 ENCOUNTER — Other Ambulatory Visit: Payer: Self-pay | Admitting: Family Medicine

## 2013-01-10 MED ORDER — BENZONATATE 200 MG PO CAPS: 200.0000 mg | ORAL_CAPSULE | Freq: Three times a day (TID) | ORAL | Status: DC | PRN

## 2013-01-13 ENCOUNTER — Ambulatory Visit (INDEPENDENT_AMBULATORY_CARE_PROVIDER_SITE_OTHER): Payer: 59 | Admitting: Family Medicine

## 2013-01-13 ENCOUNTER — Encounter: Payer: Self-pay | Admitting: Family Medicine

## 2013-01-13 VITALS — BP 133/87 | HR 80 | Wt 163.0 lb

## 2013-01-13 DIAGNOSIS — J45909 Unspecified asthma, uncomplicated: Secondary | ICD-10-CM

## 2013-01-13 DIAGNOSIS — R05 Cough: Secondary | ICD-10-CM

## 2013-01-13 DIAGNOSIS — F329 Major depressive disorder, single episode, unspecified: Secondary | ICD-10-CM

## 2013-01-13 DIAGNOSIS — R059 Cough, unspecified: Secondary | ICD-10-CM

## 2013-01-13 DIAGNOSIS — E785 Hyperlipidemia, unspecified: Secondary | ICD-10-CM

## 2013-01-13 DIAGNOSIS — R079 Chest pain, unspecified: Secondary | ICD-10-CM

## 2013-01-13 DIAGNOSIS — F3289 Other specified depressive episodes: Secondary | ICD-10-CM

## 2013-01-13 MED ORDER — TIZANIDINE HCL 4 MG PO TABS: 4.0000 mg | ORAL_TABLET | Freq: Three times a day (TID) | ORAL | Status: AC | PRN

## 2013-01-13 NOTE — Progress Notes (Signed)
  Subjective:    Patient ID: Erin Ramsey, female    DOB: 08/30/1961, 52 y.o.   MRN: [id number]  Erin Ramsey is here today concerned that she has developed some chest pain since she started running.  She also needs to have several of her medications refilled.    Chest Pain  This is a recurrent problem. The current episode started in the past 7 days. The onset quality is gradual. The problem occurs intermittently. The problem has been gradually worsening. The pain is present in the substernal region. The pain is at a severity of 5/10. The pain is moderate. The quality of the pain is described as sharp and tightness. The pain radiates to the upper back. Associated symptoms include a cough. Pertinent negatives include no diaphoresis, exertional chest pressure, nausea, palpitations, shortness of breath or sputum production. The cough's precipitants include allergen exposure. The cough is non-productive. There is no color change associated with the cough. The cough is relieved by one or more prescription drugs. The cough is worsened by activity. She has tried analgesics (Baclofen) for the symptoms.  Her past medical history is significant for diabetes, hyperlipidemia and hypertension.  Her family medical history is significant for diabetes in family, heart disease in family, hyperlipidemia in family and hypertension in family.  :   Review of Systems  Constitutional: Negative for diaphoresis.  Respiratory: Positive for cough. Negative for sputum production and shortness of breath.   Cardiovascular: Positive for chest pain. Negative for palpitations.  Gastrointestinal: Negative for nausea.   Past Medical History  Diagnosis Date  . Diabetes mellitus     type 2  . Hyperlipemia   . Hypertension   . Migraine   . Barrett's esophagus   . Colon polyps   . Esophageal stricture   . Allergic rhinitis   . Asthma   . Kidney stone   . Bladder spasms   . GERD (gastroesophageal reflux disease)    Family  History  Problem Relation Age of Onset  . COPD Mother   . Diabetes type II Mother   . Heart failure Mother   . Heart failure Mother   . Brain cancer Father        Objective:   Physical Exam  Constitutional: She appears well-nourished. No distress.  HENT:  Head: Normocephalic.  Eyes: No scleral icterus.  Neck: No thyromegaly present.  Cardiovascular: Normal rate, regular rhythm and normal heart sounds.   Pulmonary/Chest: Effort normal and breath sounds normal.  Abdominal: There is no tenderness.  Musculoskeletal: She exhibits no edema and no tenderness.  Neurological: She is alert.  Skin: Skin is warm and dry.  Psychiatric: She has a normal mood and affect. Her behavior is normal. Judgment and thought content normal.      Assessment & Plan:   1)  Chest Pain - Her EKG was WNL. She is to try some Zanaflex.    2)  Hyperlipidemia - Refilled her Crestor and Lovaza.  3)  Cough - Refilled her Tessalon Perles.   4)  Mood - Refilled her Lexapro.    5)  Asthma - Refilled her Dulera.    TIME 30 MINUTES:  MORE THAN 50 % OF TIME WAS INVOLVED IN COUNSELING.

## 2013-01-19 ENCOUNTER — Encounter: Payer: Self-pay | Admitting: Family Medicine

## 2013-01-19 DIAGNOSIS — R079 Chest pain, unspecified: Secondary | ICD-10-CM | POA: Insufficient documentation

## 2013-01-19 DIAGNOSIS — F3289 Other specified depressive episodes: Secondary | ICD-10-CM | POA: Insufficient documentation

## 2013-01-19 DIAGNOSIS — E785 Hyperlipidemia, unspecified: Secondary | ICD-10-CM | POA: Insufficient documentation

## 2013-01-19 DIAGNOSIS — F329 Major depressive disorder, single episode, unspecified: Secondary | ICD-10-CM | POA: Insufficient documentation

## 2013-01-19 DIAGNOSIS — R059 Cough, unspecified: Secondary | ICD-10-CM | POA: Insufficient documentation

## 2013-01-19 DIAGNOSIS — R05 Cough: Secondary | ICD-10-CM | POA: Insufficient documentation

## 2013-02-07 ENCOUNTER — Encounter: Payer: Self-pay | Admitting: Family Medicine

## 2013-02-07 ENCOUNTER — Ambulatory Visit (INDEPENDENT_AMBULATORY_CARE_PROVIDER_SITE_OTHER): Payer: 59 | Admitting: Family Medicine

## 2013-02-07 VITALS — BP 110/72 | HR 90 | Wt 177.0 lb

## 2013-02-07 DIAGNOSIS — IMO0001 Reserved for inherently not codable concepts without codable children: Secondary | ICD-10-CM

## 2013-02-07 DIAGNOSIS — J069 Acute upper respiratory infection, unspecified: Secondary | ICD-10-CM

## 2013-02-07 MED ORDER — KETOROLAC TROMETHAMINE 60 MG/2ML IM SOLN
60.0000 mg | Freq: Once | INTRAMUSCULAR | Status: AC
  Administered 2013-02-07: 60 mg via INTRAMUSCULAR

## 2013-02-07 MED ORDER — GLUCOSE BLOOD VI STRP: ORAL_STRIP | Status: AC

## 2013-02-07 MED ORDER — METFORMIN HCL 1000 MG PO TABS: 1000.0000 mg | ORAL_TABLET | Freq: Two times a day (BID) | ORAL | Status: AC

## 2013-02-07 MED ORDER — METHYLPREDNISOLONE SODIUM SUCC 125 MG IJ SOLR
125.0000 mg | Freq: Once | INTRAMUSCULAR | Status: AC
  Administered 2013-02-07: 125 mg via INTRAMUSCULAR

## 2013-02-07 MED ORDER — MOMETASONE FUROATE 50 MCG/ACT NA SUSP: 2.0000 | Freq: Every day | NASAL | Status: DC

## 2013-02-07 MED ORDER — ESTRADIOL 0.1 MG/24HR TD PTTW: 1.0000 | MEDICATED_PATCH | TRANSDERMAL | Status: DC

## 2013-02-07 NOTE — Patient Instructions (Addendum)

## 2013-02-07 NOTE — Progress Notes (Signed)
  Subjective:    Patient ID: Erin Ramsey, female    DOB: 1960/09/19, 52 y.o.   MRN: [id number]  Sinusitis This is a new problem. The current episode started 1 to 4 weeks ago. The problem has been gradually worsening since onset. There has been no fever. Her pain is at a severity of 7/10. The pain is moderate. Associated symptoms include congestion, coughing, headaches, a hoarse voice and sinus pressure. Pertinent negatives include no ear pain or neck pain. Past treatments include oral decongestants. The treatment provided mild relief.      Review of Systems  Constitutional: Negative for fever.  HENT: Positive for congestion, hoarse voice, rhinorrhea and sinus pressure. Negative for ear pain, neck pain and neck stiffness.   Respiratory: Positive for cough.   Neurological: Positive for headaches.   Past Medical History  Diagnosis Date  . Diabetes mellitus     type 2  . Hyperlipemia   . Hypertension   . Migraine   . Barrett's esophagus   . Colon polyps   . Esophageal stricture   . Allergic rhinitis   . Asthma   . Kidney stone   . Bladder spasms   . GERD (gastroesophageal reflux disease)    Family History  Problem Relation Age of Onset  . COPD Mother   . Diabetes type II Mother   . Heart failure Mother   . Hyperlipidemia Mother   . 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 No distress.  HENT:  Head: Normocephalic.  Mouth/Throat: No oropharyngeal exudate.  Eyes: Conjunctivae are normal. Right eye exhibits no discharge. Left eye exhibits no discharge.  Neck: Neck supple.   Cardiovascular: Normal rate, regular rhythm and normal heart sounds.  Exam reveals no gallop and no friction rub.   No murmur heard. Pulmonary/Chest: Effort normal and breath sounds normal. She has no wheezes. She exhibits no tenderness.  Lymphadenopathy:    She has no cervical adenopathy.  Neurological: She is alert.  Skin: Skin is warm and dry. No rash noted.  Psychiatric: She has a normal mood and affect.          Assessment & Plan:

## 2013-02-12 ENCOUNTER — Encounter: Payer: Self-pay | Admitting: Family Medicine

## 2013-03-02 DIAGNOSIS — J069 Acute upper respiratory infection, unspecified: Secondary | ICD-10-CM | POA: Insufficient documentation

## 2013-03-02 NOTE — Assessment & Plan Note (Signed)
Refilled her metformin 

## 2013-03-02 NOTE — Assessment & Plan Note (Signed)
She received injections of Solu-Medrol and Toradol along with a prescription for Nasonex.

## 2013-03-21 LAB — HM MAMMOGRAPHY

## 2013-03-28 ENCOUNTER — Other Ambulatory Visit: Payer: Self-pay | Admitting: *Deleted

## 2013-03-28 DIAGNOSIS — E785 Hyperlipidemia, unspecified: Secondary | ICD-10-CM

## 2013-03-28 DIAGNOSIS — R5381 Other malaise: Secondary | ICD-10-CM

## 2013-03-28 DIAGNOSIS — E559 Vitamin D deficiency, unspecified: Secondary | ICD-10-CM

## 2013-03-31 ENCOUNTER — Other Ambulatory Visit: Payer: Self-pay | Admitting: *Deleted

## 2013-03-31 ENCOUNTER — Other Ambulatory Visit: Payer: Self-pay

## 2013-03-31 DIAGNOSIS — Z780 Asymptomatic menopausal state: Secondary | ICD-10-CM

## 2013-03-31 LAB — CBC WITH DIFFERENTIAL/PLATELET
Basophils Absolute: 0 10*3/uL (ref 0.0–0.1)
Basophils Relative: 0 % (ref 0–1)
Eosinophils Absolute: 0.5 10*3/uL (ref 0.0–0.7)
Eosinophils Relative: 6 % — ABNORMAL HIGH (ref 0–5)
HCT: 40.2 % (ref 36.0–46.0)
Hemoglobin: 14.1 g/dL (ref 12.0–15.0)
Lymphocytes Relative: 40 % (ref 12–46)
Lymphs Abs: 3.5 10*3/uL (ref 0.7–4.0)
MCH: 30.4 pg (ref 26.0–34.0)
MCHC: 35.1 g/dL (ref 30.0–36.0)
MCV: 86.6 fL (ref 78.0–100.0)
Monocytes Absolute: 0.5 10*3/uL (ref 0.1–1.0)
Monocytes Relative: 5 % (ref 3–12)
Neutro Abs: 4.2 10*3/uL (ref 1.7–7.7)
Neutrophils Relative %: 49 % (ref 43–77)
Platelets: 335 10*3/uL (ref 150–400)
RBC: 4.64 MIL/uL (ref 3.87–5.11)
RDW: 12.9 % (ref 11.5–15.5)
WBC: 8.7 10*3/uL (ref 4.0–10.5)

## 2013-03-31 LAB — ESTRADIOL: Estradiol: 51.7 pg/mL

## 2013-03-31 LAB — LIPID PANEL
Cholesterol: 160 mg/dL (ref 0–200)
HDL: 56 mg/dL (ref >39)
LDL Cholesterol: 82 mg/dL (ref 0–99)
Total CHOL/HDL Ratio: 2.9 Ratio
Triglycerides: 109 mg/dL (ref <150)
VLDL: 22 mg/dL (ref 0–40)

## 2013-03-31 LAB — TSH: TSH: 2.726 u[IU]/mL (ref 0.350–4.500)

## 2013-03-31 LAB — COMPLETE METABOLIC PANEL WITH GFR
ALT: 22 U/L (ref 0–35)
AST: 18 U/L (ref 0–37)
Albumin: 4.1 g/dL (ref 3.5–5.2)
Alkaline Phosphatase: 64 U/L (ref 39–117)
BUN: 8 mg/dL (ref 6–23)
CO2: 27 mEq/L (ref 19–32)
Calcium: 9 mg/dL (ref 8.4–10.5)
Chloride: 102 mEq/L (ref 96–112)
Creat: 0.52 mg/dL (ref 0.50–1.10)
GFR, Est African American: >89 mL/min
GFR, Est Non African American: >89 mL/min
Glucose, Bld: 119 mg/dL — ABNORMAL HIGH (ref 70–99)
Potassium: 4.1 mEq/L (ref 3.5–5.3)
Sodium: 142 mEq/L (ref 135–145)
Total Bilirubin: 0.3 mg/dL (ref 0.3–1.2)
Total Protein: 5.9 g/dL — ABNORMAL LOW (ref 6.0–8.3)

## 2013-03-31 LAB — PROGESTERONE: Progesterone: <0.2 ng/mL

## 2013-03-31 LAB — FOLLICLE STIMULATING HORMONE: FSH: 38.9 m[IU]/mL

## 2013-04-01 LAB — VITAMIN D 25 HYDROXY (VIT D DEFICIENCY, FRACTURES): Vit D, 25-Hydroxy: 73 ng/mL (ref 30–89)

## 2013-04-05 LAB — ESTRONE: Estrone: 49 pg/mL

## 2013-04-07 ENCOUNTER — Other Ambulatory Visit: Payer: 59 | Admitting: Family Medicine

## 2013-04-08 ENCOUNTER — Ambulatory Visit (INDEPENDENT_AMBULATORY_CARE_PROVIDER_SITE_OTHER): Payer: 59 | Admitting: Family Medicine

## 2013-04-08 ENCOUNTER — Encounter: Payer: Self-pay | Admitting: Family Medicine

## 2013-04-08 VITALS — BP 129/81 | HR 97 | Wt 163.0 lb

## 2013-04-08 DIAGNOSIS — K219 Gastro-esophageal reflux disease without esophagitis: Secondary | ICD-10-CM

## 2013-04-08 DIAGNOSIS — E119 Type 2 diabetes mellitus without complications: Secondary | ICD-10-CM

## 2013-04-08 DIAGNOSIS — N951 Menopausal and female climacteric states: Secondary | ICD-10-CM

## 2013-04-08 DIAGNOSIS — N760 Acute vaginitis: Secondary | ICD-10-CM

## 2013-04-08 LAB — POCT GLYCOSYLATED HEMOGLOBIN (HGB A1C): Hemoglobin A1C: 6.3

## 2013-04-08 MED ORDER — CANAGLIFLOZIN 300 MG PO TABS: 1.0000 | ORAL_TABLET | Freq: Every day | ORAL | Status: DC

## 2013-04-08 MED ORDER — METRONIDAZOLE 0.75 % VA GEL: 1.0000 | Freq: Two times a day (BID) | VAGINAL | Status: DC

## 2013-04-08 MED ORDER — ESOMEPRAZOLE MAGNESIUM 40 MG PO CPDR: 40.0000 mg | DELAYED_RELEASE_CAPSULE | Freq: Every day | ORAL | Status: DC

## 2013-04-08 MED ORDER — PROGESTERONE MICRONIZED 100 MG PO CAPS: 100.0000 mg | ORAL_CAPSULE | Freq: Every day | ORAL | Status: DC

## 2013-04-08 NOTE — Patient Instructions (Addendum)
Menopause Menopause is the normal time of life when menstrual periods stop completely. Menopause is complete when you have missed 12 consecutive menstrual periods. It usually occurs between the ages of 48 to 55, with an average age of 51. Very rarely does a woman develop menopause before 52 years old. At menopause, your ovaries stop producing the female hormones, estrogen and progesterone. This can cause undesirable symptoms and also affect your health. Sometimes the symptoms may occur 4 to 5 years before the menopause begins. There is no relationship between menopause and:  Oral contraceptives.  Number of children you had.  Race.  The age your menstrual periods started (menarche). Heavy smokers and very thin women may develop menopause earlier in life. CAUSES  The ovaries stop producing the female hormones estrogen and progesterone.  Other causes include:  Surgery to remove both ovaries.  The ovaries stop functioning for no known reason.  Tumors of the pituitary gland in the brain.  Medical disease that affects the ovaries and hormone production.  Radiation treatment to the abdomen or pelvis.  Chemotherapy that affects the ovaries. SYMPTOMS   Hot flashes.  Night sweats.  Decrease in sex drive.  Vaginal dryness and thinning of the vagina causing painful intercourse.  Dryness of the skin and developing wrinkles.  Headaches.  Tiredness.  Irritability.  Memory problems.  Weight gain.  Bladder infections.  Hair growth of the face and chest.  Infertility. More serious symptoms include:  Loss of bone (osteoporosis) causing breaks (fractures).  Depression.  Hardening and narrowing of the arteries (atherosclerosis) causing heart attacks and strokes. DIAGNOSIS   When the menstrual periods have stopped for 12 straight months.  Physical exam.  Hormone studies of the blood. TREATMENT  There are many treatment choices and nearly as many questions about them.  The decisions to treat or not to treat menopausal changes is an individual choice made with your caregiver. Your caregiver can discuss the treatments with you. Together, you can decide which treatment will work best for you. Your treatment choices may include:   Hormone therapy (estorgen and progesterone).  Non-hormonal medications.  Treating the individual symptoms with medication (for example antidepressants for depression).  Herbal medications that may help specific symptoms.  Counseling by a psychiatrist or psychologist.  Group therapy.  Lifestyle changes including:  Eating healthy.  Regular exercise.  Limiting caffeine and alcohol.  Stress management and meditation.  No treatment. HOME CARE INSTRUCTIONS   Take the medication your caregiver gives you as directed.  Get plenty of sleep and rest.  Exercise regularly.  Eat a diet that contains calcium (good for the bones) and soy products (acts like estrogen hormone).  Avoid alcoholic beverages.  Do not smoke.  If you have hot flashes, dress in layers.  Take supplements, calcium and vitamin D to strengthen bones.  You can use over-the-counter lubricants or moisturizers for vaginal dryness.  Group therapy is sometimes very helpful.  Acupuncture may be helpful in some cases. SEEK MEDICAL CARE IF:   You are not sure you are in menopause.  You are having menopausal symptoms and need advice and treatment.  You are still having menstrual periods after age 55.  You have pain with intercourse.  Menopause is complete (no menstrual period for 12 months) and you develop vaginal bleeding.  You need a referral to a specialist (gynecologist, psychiatrist or psychologist) for treatment. SEEK IMMEDIATE MEDICAL CARE IF:   You have severe depression.  You have excessive vaginal bleeding.  You fell and   think you have a broken bone.  You have pain when you urinate.  You develop leg or chest pain.  You have a fast  pounding heart beat (palpitations).  You have severe headaches.  You develop vision problems.  You feel a lump in your breast.  You have abdominal pain or severe indigestion. Document Released: 11/18/2003 Document Revised: 11/20/2011 Document Reviewed: 06/25/2008 Exit[employer] Patient Information 2014 [employer].  Menopause and Herbal Products Menopause is the normal time of life when menstrual periods stop completely. Menopause is complete when you have missed 12 consecutive menstrual periods. It usually occurs between the ages of 48 to 55, with an average age of 51. Very rarely does a woman develop menopause before 52 years old. At menopause, your ovaries stop producing the female hormones, estrogen and progesterone. This can cause undesirable symptoms and also affect your health. Sometimes the symptoms can occur 4 to 5 years before the menopause begins. There is no relationship between menopause and:  Oral contraceptives.  Number of children you had.  Race.  The age your menstrual periods started (menarche). Heavy smokers and very thin women may develop menopause earlier in life. Estrogen and progesterone hormone treatment is the usual method of treating menopausal symptoms. However, there are women who should not take hormone treatment. This is true of:   Women that have breast or uterine cancer.  Women who prefer not to take hormones because of certain side effects (abnormal uterine bleeding).  Women who are afraid that hormones may cause breast cancer.  Women who have a history of liver disease, heart disease, stroke, or blood clots. For these women, there are other medications that may help treat their menopausal symptoms. These medications are found in plants and botanical products. They can be found in the form of herbs, teas, oils, tinctures, and pills.  CAUSES:  The ovaries stop producing the female hormones estrogen and progesterone.  Other causes include:  Surgery  to remove both ovaries.  The ovaries stop functioning for no know reason.  Tumors of the pituitary gland in the brain.  Medical disease that affects the ovaries and hormone production.  Radiation treatment to the abdomen or pelvis.  Chemotherapy that affects the ovaries. PHYTOESTROGENS: Phytoestrogens occur naturally in plants and plant products. They act like estrogen in the body. Herbal medications are made from these plants and botanical steroids. There are 3 types of phytoestrogens:  Isoflavones (genistein and daidzein) are found in soy, garbanzo beans, miso and tofu foods.  Ligins are found in the shell of seeds. They are used to make oils like flaxseed oil. The bacteria in your intestine act on these foods to produce the estrogen-like hormones.  Coumestans are estrogen-like. Some of the foods they are found in include sunflower seeds and bean sprouts. CONDITIONS AND THEIR POSSIBLE HERBAL TREATMENT:  Hot flashes and night sweats.  Soy, black cohosh and evening primrose.  Irritability, insomnia, depression and memory problems.  Chasteberry, ginseng, and soy.  St. John's wort may be helpful for depression. However, there is a concern of it causing cataracts of the eye and may have bad effects on other medications. St. John's wort should not be taken for long time and without your caregiver's advice.  Loss of libido and vaginal and skin dryness.  Wild yam and soy.  Prevention of coronary heart disease and osteoporosis.  Soy and Isoflavones. Several studies have shown that some women benefit from herbal medications, but most of the studies have not consistently shown that these   supplements are much better than placebo. Other forms of treatment to help women with menopausal symptoms include a balanced diet, rest, exercise, vitamin and calcium (with vitamin D) supplements, acupuncture, and group therapy when necessary. THOSE WHO SHOULD NOT TAKE HERBAL MEDICATIONS  INCLUDE:  Women who are planning on getting pregnant unless told by your caregiver.  Women who are breastfeeding unless told by your caregiver.  Women who are taking other prescription medications unless told by your caregiver.  Infants, children, and elderly women unless told by your caregiver. Different herbal medications have different and unmeasured amounts of the herbal ingredients. There are no regulations, quality control, and standardization of the ingredients in herbal medications. Therefore, the amount of the ingredient in the medication may vary from one herb, pill, tea, oil or tincture to another. Many herbal medications can cause serious problems and can even have poisonous effects if taken too much or too long. If problems develop, the medication should be stopped and recorded by your caregiver. HOME CARE INSTRUCTIONS  Do not take or give children herbal medications without your caregiver's advice.  Let your caregiver know all the medications you are taking. This includes prescription, over-the-counter, eye drops, and creams.  Do not take herbal medications longer or more than recommended.  Tell your caregiver about any side effects from the medication. SEEK MEDICAL CARE IF:  You develop a fever of 102 F (38.9 C), or as directed by your caregiver.  You feel sick to your stomach (nauseous), vomit, or have diarrhea.  You develop a rash.  You develop abdominal pain.  You develop severe headaches.  You start to have vision problems.  You feel dizzy or faint.  You start to feel numbness in any part of your body.  You start shaking (have convulsions). Document Released: 02/14/2008 Document Revised: 08/14/2012 Document Reviewed: 09/13/2010 ExitCare Patient Information 2014 [employer].  

## 2013-04-08 NOTE — Progress Notes (Signed)
  Subjective:    Patient ID: Erin Ramsey, female    DOB: Jul 18, 1961, 52 y.o.   MRN: [id number]  HPI  Erin Ramsey is here today to follow up on her recent lab results, get refills for some of her medications and discuss the conditions listed below:  1)  Type II DM: She is doing well on the Metformin. She does not keep a regular check of her sugars at home.   2)  Hyperlipidemia: She only took the Crestor a few days. She felt that it was causing her joint pain so she stopped taking it.    3)  Mood: She is taking Lexapro but has been having some problems she would like to discuss.  Review of Systems  Constitutional: Positive for fatigue.        Mood changes  HENT: Negative.   Eyes: Negative.   Respiratory: Positive for chest tightness.   Cardiovascular: Negative.   Gastrointestinal: Negative.   Endocrine: Negative for polyphagia and polyuria.  Genitourinary: Negative.        Sweats  Musculoskeletal:       She was having some joint pain while taking Crestor.  Skin: Negative.   Allergic/Immunologic: Negative.   Neurological: Negative.   Hematological: Negative.   Psychiatric/Behavioral: Positive for sleep disturbance. The patient is nervous/anxious.     Past Medical History  Diagnosis Date  . Diabetes mellitus     type 2  . Hyperlipemia   . Hypertension   . Migraine   . Barrett's esophagus   . Colon polyps   . Esophageal stricture   . Allergic rhinitis   . Asthma   . Kidney stone   . Bladder spasms   . GERD (gastroesophageal reflux disease)     Family History  Problem Relation Age of Onset  . COPD Mother   . Diabetes type II Mother   . Heart failure Mother   . Hyperlipidemia Mother   . 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 Constitutional: She is oriented to person, place, and time.  Eyes: Conjunctivae are normal. No scleral icterus.  Neck: Neck supple. No thyromegaly present.  Cardiovascular: Normal rate, regular rhythm and normal heart sounds.   Pulmonary/Chest: Effort normal and breath sounds normal.  Musculoskeletal: She exhibits no edema and no tenderness.  Lymphadenopathy:    She has no cervical adenopathy.  Neurological: She is alert and oriented to person, place, and time.  Skin: Skin is warm and dry.  Psychiatric: She has a normal mood and affect. Her behavior is normal. Judgment and thought content normal.      Assessment & Plan:

## 2013-05-02 ENCOUNTER — Other Ambulatory Visit: Payer: Self-pay | Admitting: Family Medicine

## 2013-05-03 IMAGING — CR DG CHEST 2V
2 series · 2 of 2 positions shown · non-contrast
Comparison: None.

CLINICAL DATA: Complains of shortness of breath.

CHEST - 2 VIEW

[w chest pa]
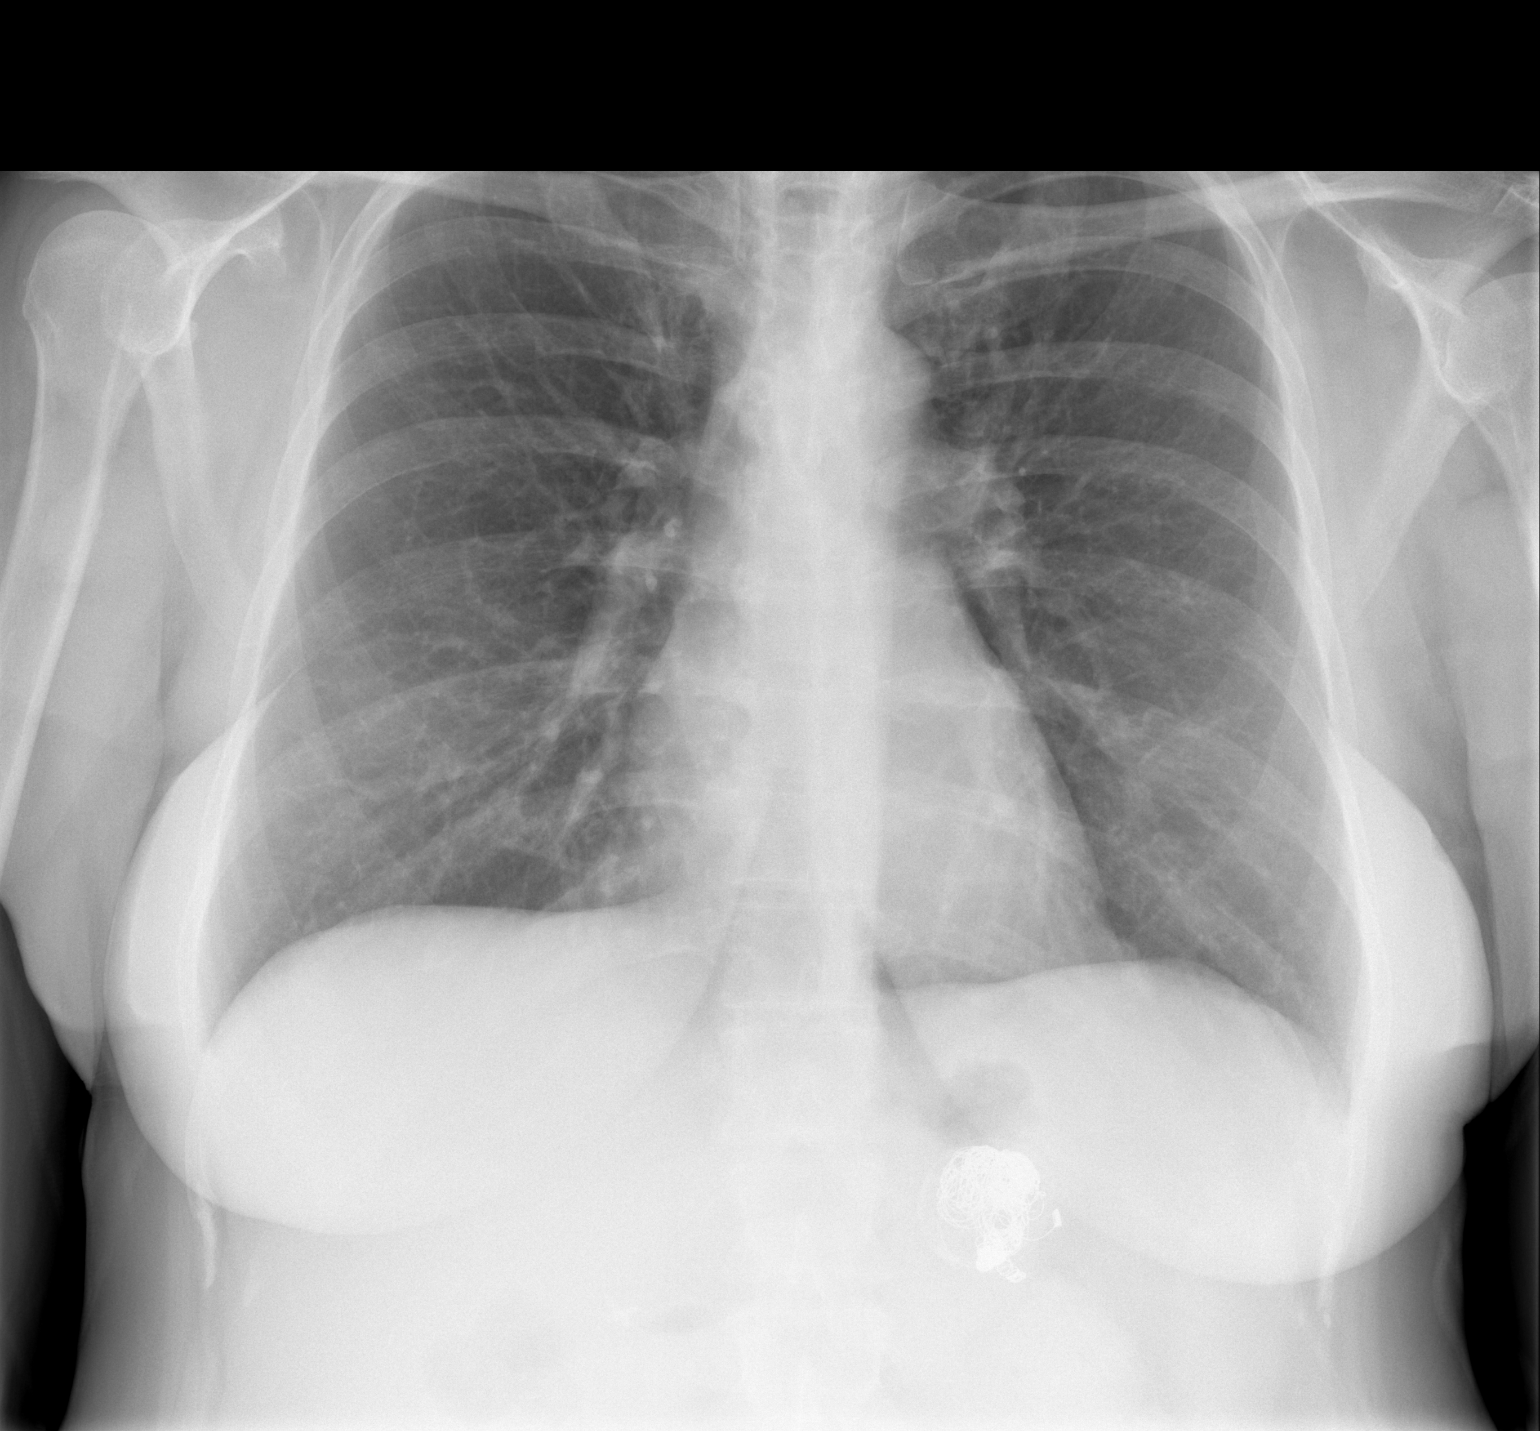

[w chest lat]
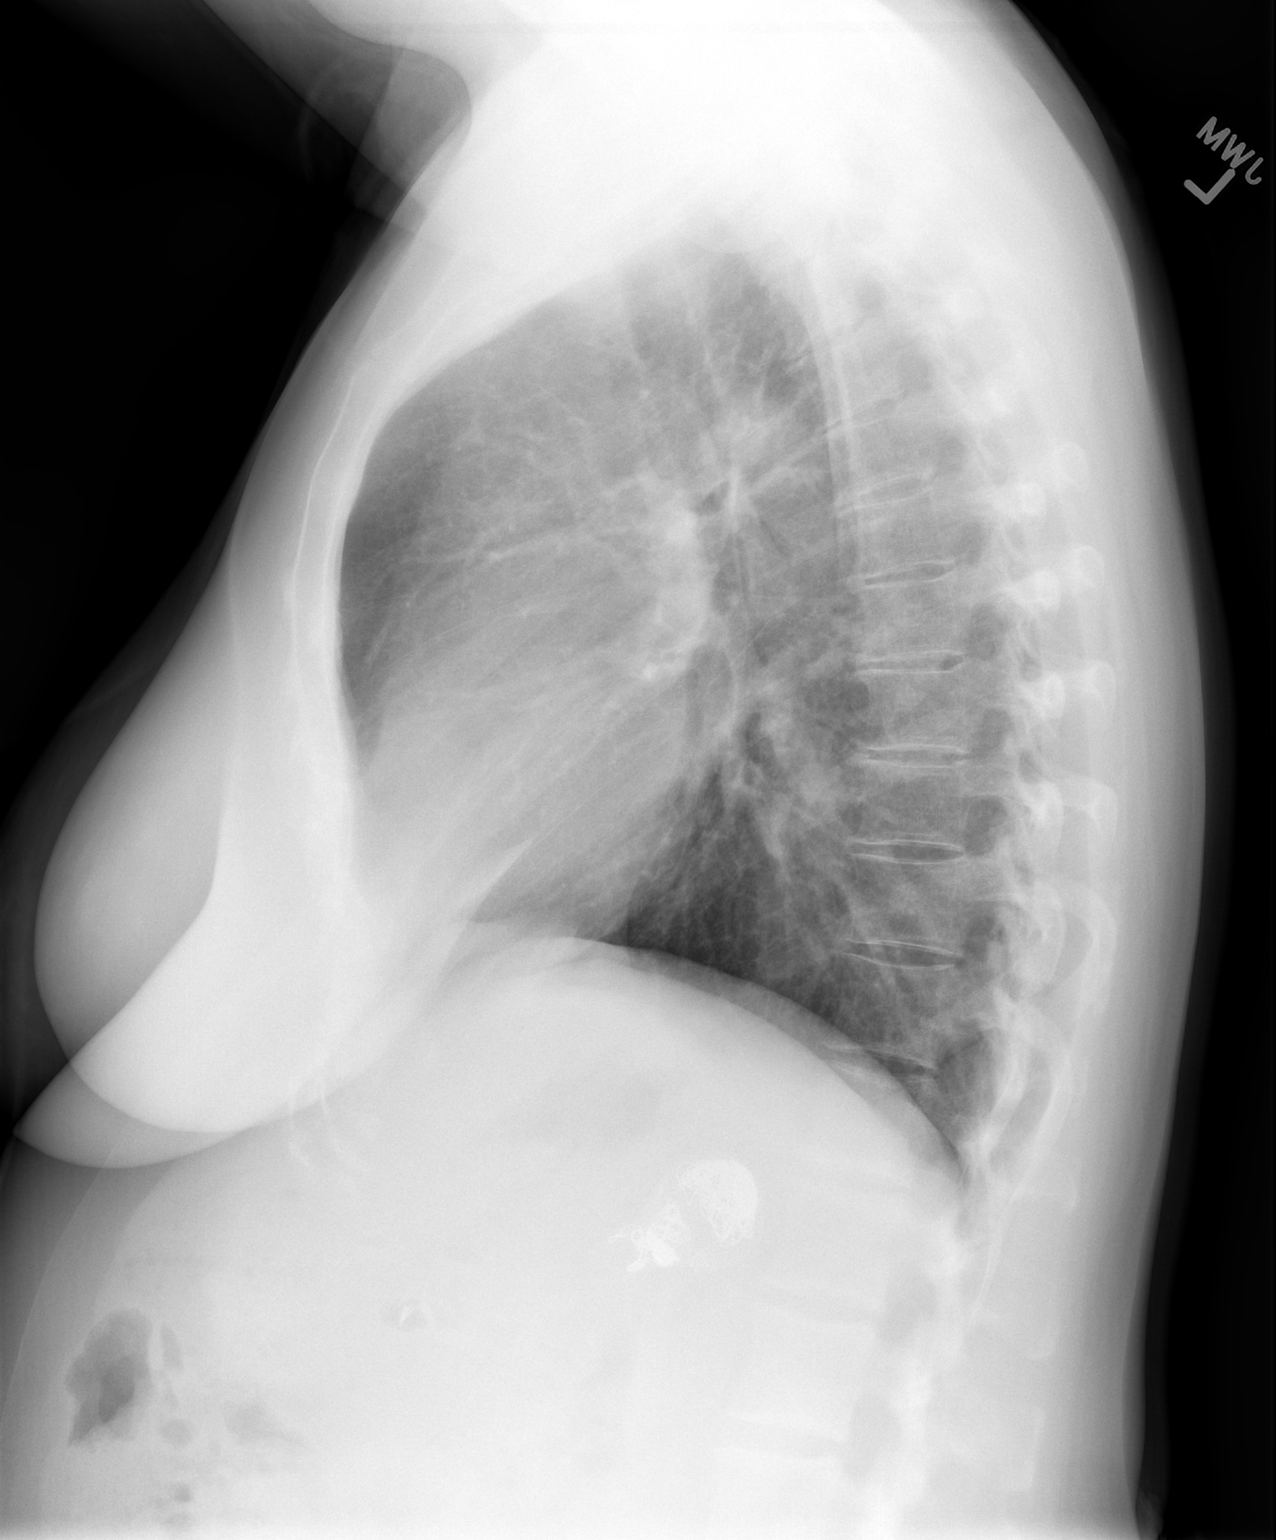

[2 of 2 positions shown; findings below may reference images not displayed]

FINDINGS: The heart, mediastinal, and hilar contours are within
normal limits and stable.  The lungs are well expanded. There is
slight peribronchial thickening.  No focal airspace disease, edema,
or pleural effusion.  Negative for pneumothorax.  Bony thorax is
unremarkable.  Metallic coils in the left abdomen are noted and may
reflect prior aneurysm coiling.
IMPRESSION: Slight peribronchial thickening.  This can be seen in the setting
of acute or chronic bronchitis, smoking, or asthma.

## 2013-05-13 ENCOUNTER — Encounter: Payer: Self-pay | Admitting: Family Medicine

## 2013-05-13 ENCOUNTER — Ambulatory Visit (INDEPENDENT_AMBULATORY_CARE_PROVIDER_SITE_OTHER): Payer: 59 | Admitting: Family Medicine

## 2013-05-13 VITALS — BP 112/76 | HR 91 | Wt 165.0 lb

## 2013-05-13 DIAGNOSIS — R51 Headache: Secondary | ICD-10-CM

## 2013-05-13 DIAGNOSIS — R110 Nausea: Secondary | ICD-10-CM

## 2013-05-13 DIAGNOSIS — E119 Type 2 diabetes mellitus without complications: Secondary | ICD-10-CM

## 2013-05-13 MED ORDER — INSULIN PEN NEEDLE 32G X 6 MM MISC: Status: DC

## 2013-05-13 MED ORDER — BUTALBITAL-ACETAMINOPHEN 50-300 MG PO TABS: 1.0000 | ORAL_TABLET | Freq: Two times a day (BID) | ORAL | Status: DC

## 2013-05-13 MED ORDER — PROMETHAZINE HCL 12.5 MG PO TABS: 12.5000 mg | ORAL_TABLET | Freq: Three times a day (TID) | ORAL | Status: DC | PRN

## 2013-05-13 MED ORDER — LIRAGLUTIDE 18 MG/3ML ~~LOC~~ SOPN: 1.8000 mg | PEN_INJECTOR | Freq: Every day | SUBCUTANEOUS | Status: DC

## 2013-05-13 MED ORDER — METHYLPREDNISOLONE SODIUM SUCC 125 MG IJ SOLR
125.0000 mg | Freq: Once | INTRAMUSCULAR | Status: AC
  Administered 2013-05-13: 125 mg via INTRAMUSCULAR

## 2013-05-13 NOTE — Progress Notes (Signed)
  Subjective:    Patient ID: Erin Ramsey, female    DOB: 02/05/1961, 52 y.o.   MRN: [id number]  HPI  Erin Ramsey is here today concerned about her Type II DM.  She has been feeling very fatigued for the past 2 weeks and has had what she describes as a "sinus headache".  She has also been very thirsty so she started back on the Invokana which she had stopped before because she just didn't feel good when she was taking it.  Within a couple of days of being on the [place], she started having burning with urination and itching so she took a Diflucan and her bottom is feeling better.  She checked a fasting blood sugar this morning which was 169 mg/dL which is really high for her because she normally runs in the low 100s.  She has not changed her diet or her activity level.      Review of Systems  Constitutional: Positive for fatigue and unexpected weight change.  Endocrine: Positive for polydipsia and polyuria.  Musculoskeletal: Negative.   Neurological: Positive for headaches.  Hematological: Negative.   Psychiatric/Behavioral: Negative.     Past Medical History  Diagnosis Date  . Diabetes mellitus     type 2  . Hyperlipemia   . Hypertension   . Migraine   . Barrett's esophagus   . Colon polyps   . Esophageal stricture   . Allergic rhinitis   . Asthma   . Kidney stone   . Bladder spasms   . GERD (gastroesophageal reflux disease)     Family History  Problem Relation Age of Onset  . COPD Mother   . Diabetes type II Mother   . Heart failure Mother   . Hyperlipidemia Mother   . Brain cancer Father     History   Social History  . Marital Status: Married    Spouse Name: Brett Canales     Number of Children: 2  . Years of Education: 16   Occupational History  . 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 Smoking status: Never Smoker   . Smokeless tobacco: Never Used  . Alcohol Use: No  . Drug Use: No  . Sexual Activity: Yes   Partners: Male   Other Topics Concern  . 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 Constitutional: She is oriented to person, place, and time. She appears well-developed and well-nourished. No distress.  Cardiovascular: Normal rate and regular rhythm.   Pulmonary/Chest: Effort normal and breath sounds normal.  Neurological: She is alert and oriented to person, place, and time.  Skin: Skin is warm and dry.  Psychiatric: She has a normal mood and affect.      Assessment & Plan:

## 2013-05-13 NOTE — Patient Instructions (Addendum)
1)  Headache/Sinus Pressure - You received an injection of Solu-Medrol.  Take the Norel AD up to 4-6 times per day according to the directions.  You can also try some Bupap but limit the amount you do.   it  2)  Blood Sugar - Start on Victoza .6 then increase to 1.8 as tolerated.  Drink plenty of water and watch your diet more carefully.     Diabetes and Sick Day Management Blood sugar (glucose) can be more difficult to control when you are sick. Colds, fever, flu, nausea, vomiting, and diarrhea are all examples of common illnesses that can cause problems for people with diabetes. Loss of body fluids (dehydration) from fever, vomiting, diarrhea, infection, and the stress of a sickness can all cause blood glucose levels to rise. Because of this, it is very important to take your diabetes medicines and to eat some form of carbohydrate food when you are sick. Liquid or soft foods are often tolerated, and they help to replace fluids. HOME CARE INSTRUCTIONS These main guidelines are intended for managing a short-term (24 hours or less) sickness:  Take your usual dose of insulin or oral diabetes medicine. An exception would be if you take any form of metformin. If you cannot eat or drink, you can become dehydrated and should not take this medicine.  Continue to take your insulin even if you are unable to eat solid foods or are vomiting. Your insulin dose may stay the same, or it may need to be increased when you are sick.  You will need to test your blood glucose more often, generally every 2 4 hours. If you have type 1 diabetes, test your urine for ketones every 4 hours. If you have type 2 diabetes, test your urine for ketones as directed by your caregiver.  Eat some form of food that contains carbohydrates. The carbohydrates can be in solid or liquid form. You should eat 45 50 grams of carbohydrates every 3 4 hours.  Replace fluids if fever, vomiting, or diarrhea is present. Ask your caregiver for  specific rehydration instructions.  Watch carefully for the signs of ketoacidosis if you have type 1 diabetes. Call your caregiver if any of the following symptoms are present, especially in children:  Moderate to large ketones in the urine along with a high blood glucose level.  Severe nausea.  Vomiting.  Diarrhea.  Abdominal pain.  Rapid breathing.  Drink extra liquids that do not contain sugar, such as water or sugar-free liquids, if your blood glucose is higher than 240 mg/dl.  Be careful with over-the-counter medicines. Read the labels. They may contain sugar or types of sugars that can raise your blood glucose. Food Choices for Illness All of the food choices below contain around 15 grams of carbohydrates. Plan ahead and keep some of these foods around.    to  cup carbonated beverage containing sugar. Carbonated beverages will usually be better tolerated if they are opened and left at room temperature for a few minutes.   of a twin frozen ice pop.   cup sweetened gelatin dessert.   cup juice.   cup ice cream or frozen yogurt.   cup cooked cereal.   cup sherbet.  1 cup broth-based soup with noodles or rice, reconstituted with water.  1 cup cream soup.   cup regular custard.   cup regular pudding.  1 cup sports drink.  1 cup plain yogurt.  1 slice toast.  6 squares saltine crackers.  5 vanilla  wafers. SEEK MEDICAL CARE IF:   You are unable to eat food or drink fluids for more than 6 hours.  You have nausea and vomiting for more than 6 hours.  Your blood glucose level is over 240 mg/dL.  There is a change in mental status.  You develop an additional serious illness.  You have diarrhea for more than 6 hours.  You have been sick or have had a fever for a couple of days and are not getting better. SEEK IMMEDI   You have difficulty breathing.  You have moderate to large ketone levels. Document Released: 08/31/2003 Document Revised:  05/22/2012 Document Reviewed: 03/07/2011 [facility] Patient Information 2014 [place], [place].

## 2013-05-14 DIAGNOSIS — E119 Type 2 diabetes mellitus without complications: Secondary | ICD-10-CM | POA: Insufficient documentation

## 2013-05-14 DIAGNOSIS — R51 Headache: Secondary | ICD-10-CM | POA: Insufficient documentation

## 2013-05-14 DIAGNOSIS — R110 Nausea: Secondary | ICD-10-CM | POA: Insufficient documentation

## 2013-05-14 DIAGNOSIS — R519 Headache, unspecified: Secondary | ICD-10-CM | POA: Insufficient documentation

## 2013-05-18 DIAGNOSIS — N760 Acute vaginitis: Secondary | ICD-10-CM | POA: Insufficient documentation

## 2013-05-18 NOTE — Assessment & Plan Note (Signed)
She is going to add Invokana to her metformin.

## 2013-05-18 NOTE — Assessment & Plan Note (Addendum)
Her FSH has increased to the level showing that she is in menopause.  She is going to try Estrovan for her hot flashes and some Prometrium for mood and sleep.

## 2013-05-18 NOTE — Assessment & Plan Note (Signed)
Refilled her Nexium.

## 2013-05-18 NOTE — Assessment & Plan Note (Signed)
She was given a prescription for Metrogel.

## 2013-05-19 ENCOUNTER — Encounter: Payer: Self-pay | Admitting: *Deleted

## 2013-05-26 ENCOUNTER — Ambulatory Visit: Payer: Self-pay | Admitting: Family Medicine

## 2013-06-05 ENCOUNTER — Ambulatory Visit (INDEPENDENT_AMBULATORY_CARE_PROVIDER_SITE_OTHER): Payer: 59 | Admitting: Family Medicine

## 2013-06-05 ENCOUNTER — Encounter: Payer: Self-pay | Admitting: Family Medicine

## 2013-06-05 VITALS — BP 111/76 | HR 91 | Resp 16 | Ht 61.5 in | Wt 169.0 lb

## 2013-06-05 DIAGNOSIS — Z23 Encounter for immunization: Secondary | ICD-10-CM

## 2013-06-05 DIAGNOSIS — E119 Type 2 diabetes mellitus without complications: Secondary | ICD-10-CM

## 2013-06-05 DIAGNOSIS — N760 Acute vaginitis: Secondary | ICD-10-CM

## 2013-06-05 DIAGNOSIS — F411 Generalized anxiety disorder: Secondary | ICD-10-CM

## 2013-06-05 DIAGNOSIS — D649 Anemia, unspecified: Secondary | ICD-10-CM

## 2013-06-05 DIAGNOSIS — Z0000 Encounter for general adult medical examination without abnormal findings: Secondary | ICD-10-CM

## 2013-06-05 LAB — POCT URINALYSIS DIPSTICK
Bilirubin, UA: NEGATIVE
Blood, UA: NEGATIVE
Glucose, UA: NEGATIVE
Ketones, UA: NEGATIVE
Leukocytes, UA: NEGATIVE
Nitrite, UA: NEGATIVE
Protein, UA: NEGATIVE
Spec Grav, UA: <=1.005
Urobilinogen, UA: NEGATIVE
pH, UA: 7.5

## 2013-06-05 MED ORDER — HEMATINIC PLUS VIT/MINERALS 106-1 MG PO TABS: 1.0000 | ORAL_TABLET | Freq: Every day | ORAL | Status: AC

## 2013-06-05 MED ORDER — HYDROXYZINE PAMOATE 25 MG PO CAPS: ORAL_CAPSULE | ORAL | Status: AC

## 2013-06-05 MED ORDER — DAPAGLIFLOZIN PROPANEDIOL 10 MG PO TABS: 10.0000 mg | ORAL_TABLET | ORAL | Status: DC

## 2013-06-05 MED ORDER — METRONIDAZOLE 0.75 % VA GEL: 1.0000 | Freq: Two times a day (BID) | VAGINAL | Status: DC

## 2013-06-05 NOTE — Progress Notes (Signed)
Subjective:    Patient ID: Erin Ramsey, female    DOB: 01/16/1961, 52 y.o.   MRN: [id number]  HPI  Erin Ramsey is here today for her annual CPE.  She has done well since her last office visit.  She needs some of her medications refilled.   1)  Mood:  She is doing well on the combination of Lexapro and Vistaril.  She needs a refill of her Vistaril.   2)  GERD:  She continues to do well with taking Nexium daily and some GI cocktail as needed.  She needs a refill on her GI cocktail.   3)  Anemia:  She needs a refill of her Hematinic Plus.    Review of Systems  Constitutional: Negative.   HENT: Negative.   Eyes: Negative.   Respiratory: Negative.   Cardiovascular: Negative.   Gastrointestinal: Negative.   Endocrine: Negative.   Genitourinary: Negative.   Musculoskeletal: Negative.   Skin: Negative.   Allergic/Immunologic: Negative.   Neurological: Negative.   Hematological: Negative.   Psychiatric/Behavioral: Negative.      Past Medical History  Diagnosis Date  . Hyperlipemia   . Hypertension   . Migraine   . Barrett's esophagus   . Colon polyps   . Esophageal stricture   . Allergic rhinitis   . Asthma   . Kidney stone   . Bladder spasms   . GERD (gastroesophageal reflux disease)   . Pneumonia 2012    3 Episodes in 2012  . Chronic sinusitis   . Anxiety   . Depression   . Type II or unspecified type diabetes mellitus without mention of complication, not stated as uncontrolled      Family History  Problem Relation Age of Onset  . COPD Mother   . Diabetes type II Mother   . Heart failure Mother   . Hyperlipidemia Mother   . 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 Constitutional: She is oriented to person, place, and time. She appears well-developed and well-nourished.  HENT:  Head: Normocephalic and atraumatic.  Right Ear: External ear normal.  Left Ear: External ear normal.  Nose: Nose normal.  Mouth/Throat: Oropharynx is clear and moist.  Eyes: Conjunctivae and EOM are normal. Pupils are equal, round, and reactive to light.  Neck: Normal range of motion. No thyromegaly present.  Cardiovascular: Normal rate, regular rhythm, normal heart sounds and intact distal pulses.  Exam reveals no gallop and no friction rub.   No murmur heard. Pulmonary/Chest: Effort normal and breath sounds normal. Right breast exhibits no inverted nipple, no mass, no nipple discharge, no skin change and no tenderness. Left breast exhibits no inverted nipple, no mass, no nipple discharge, no skin change and no tenderness. Breasts are symmetrical.  Abdominal: Soft. Bowel sounds are normal. Hernia confirmed negative in the right inguinal area and confirmed negative in the left inguinal area.  Genitourinary: Vagina normal. Pelvic exam was performed with patient supine. There  is no rash, tenderness or lesion on the right labia. There is no rash, tenderness or lesion on the left labia.  Musculoskeletal: Normal range of motion. She exhibits no edema and no tenderness.  Lymphadenopathy:    She has no cervical adenopathy.       Right: No inguinal adenopathy present.       Left: No inguinal adenopathy present.  Neurological: She is alert and oriented to person, place, and time. She has normal reflexes.  Skin: Skin is warm and dry.  Psychiatric: She has a normal mood and affect. Her behavior is normal. Judgment and thought content normal.      Assessment & Plan:    Erin Ramsey was seen today for annual exam and medication refill.  Diagnoses and associated orders for this  visit:  Routine general medical examination at a health care facility Comments: Normal exam; she was given stool cards to complete and return.   - POCT urinalysis dipstick  Need for prophylactic vaccination and inoculation against influenza Comments: Receive her flu shot without difficulty.  - Flu Vaccine QUAD 36+ mos PF IM (Fluarix)  Vaginitis and vulvovaginitis Comments: She was given a prescription for Metrogel to use if she develops a vaginal discharge or irritation.   - Discontinue: metroNIDAZOLE (METROGEL VAGINAL) 0.75 % vaginal gel; Place 1 Applicatorful vaginally 2 (two) times daily.  Type II or unspecified type diabetes mellitus without mention of complication, not stated as uncontrolled Comments: [name].   - Discontinue: Dapagliflozin Propanediol (FARXIGA) 10 MG TABS; Take 10 mg by mouth every morning.  Anemia - Fe Fum-FA-B Cmp-C-Zn-Mg-Mn-Cu (HEMATINIC PLUS VIT/MINERALS) 106-1 MG TABS; Take 1 tablet by mouth daily.  Anxiety state, unspecified - hydrOXYzine (VISTARIL) 25 MG capsule; Take 1 capsule daily as needed   TIME SPENT "FACE TO FACE" WITH PATIENT - 60 MINS

## 2013-06-06 ENCOUNTER — Other Ambulatory Visit: Payer: Self-pay | Admitting: Family Medicine

## 2013-06-20 ENCOUNTER — Other Ambulatory Visit: Payer: Self-pay | Admitting: *Deleted

## 2013-06-20 ENCOUNTER — Other Ambulatory Visit (INDEPENDENT_AMBULATORY_CARE_PROVIDER_SITE_OTHER): Payer: 59

## 2013-06-20 DIAGNOSIS — Z1210 Encounter for screening for malignant neoplasm of intestinal tract, unspecified: Secondary | ICD-10-CM

## 2013-06-20 LAB — HEMOCCULT GUIAC POC 1CARD (OFFICE)
Card #2 Fecal Occult Blod, POC: NEGATIVE
Card #3 Fecal Occult Blood, POC: NEGATIVE
Fecal Occult Blood, POC: NEGATIVE

## 2013-07-08 IMAGING — CT CT PARANASAL SINUSES LIMITED
1 series · 10 of 12 positions shown, 13 images · non-contrast
Comparison: 09/18/2011

CLINICAL DATA: Sinus pressure, congestion, headache.

CT LIMITED SINUSES WITHOUT CONTRAST
TECHNIQUE: Multidetector CT images of the paranasal sinuses were
obtained in a single plane without contrast.

[Series 3: sinus prone 5.0 h31s · axial · 0.27mm/px · z∈[+875,+970]mm · 10 of 12 slices shown, 13 images]
[im 2/12  brain]
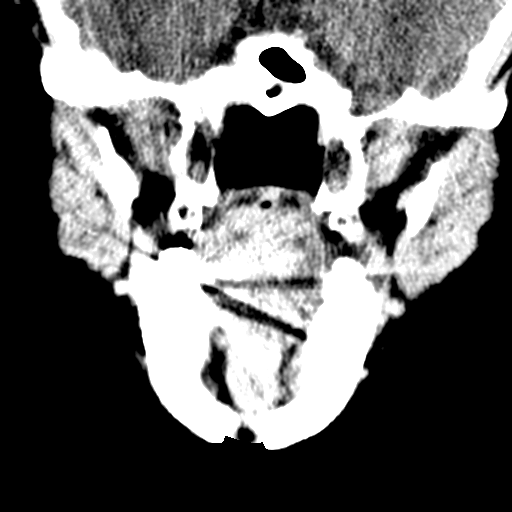
[im 2/12  bone]
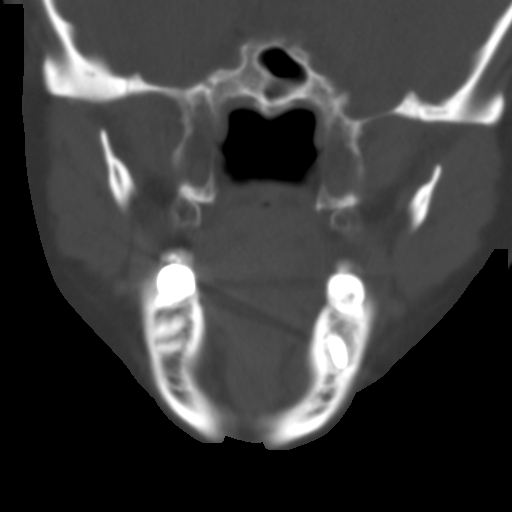
[im 3/12  bone]
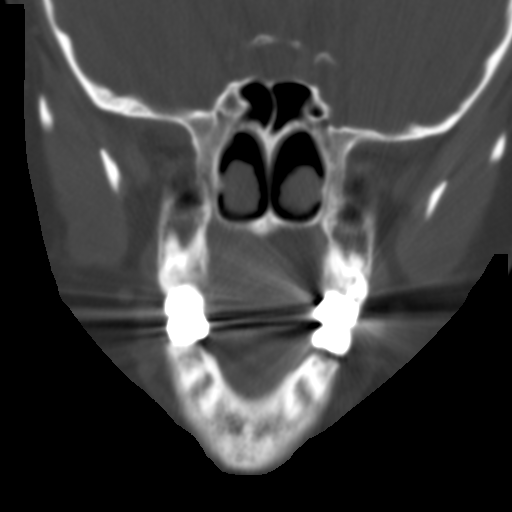
[im 4/12  bone]
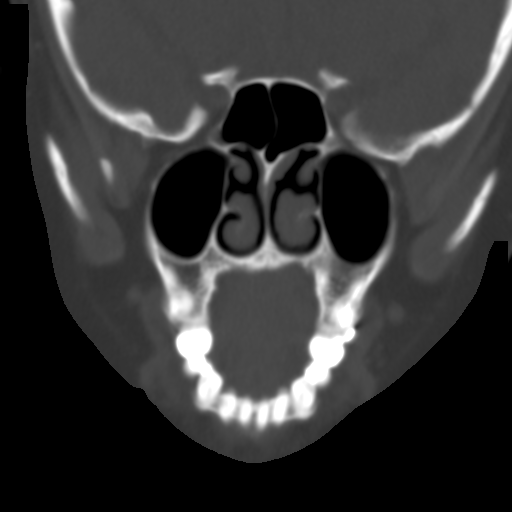
[im 5/12  bone]
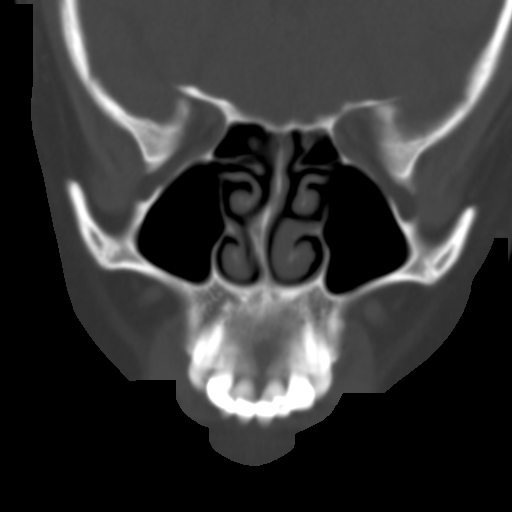
[im 6/12  brain]
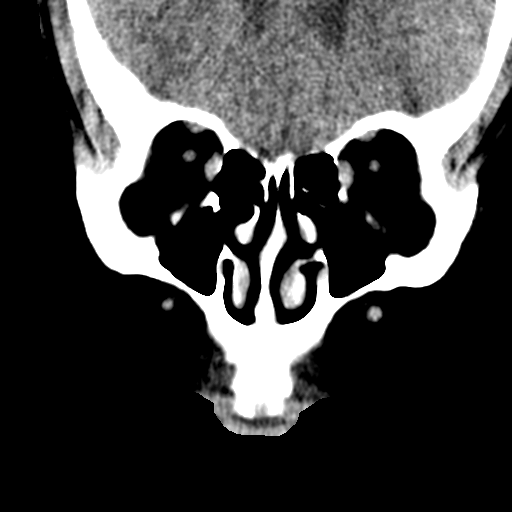
[im 6/12  bone]
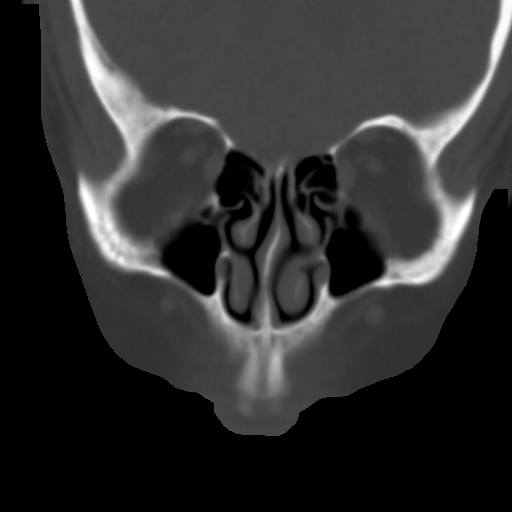
[im 7/12  bone]
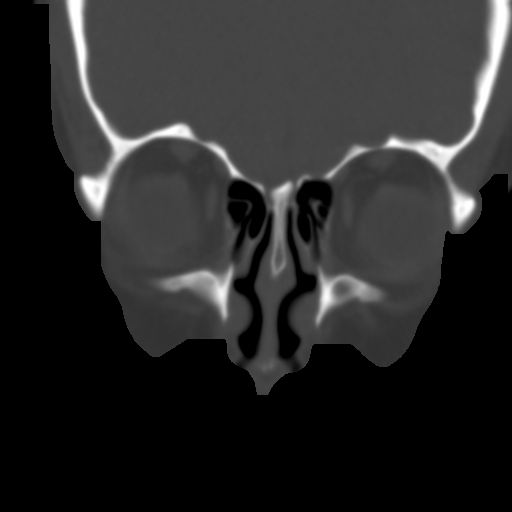
[im 8/12  bone]
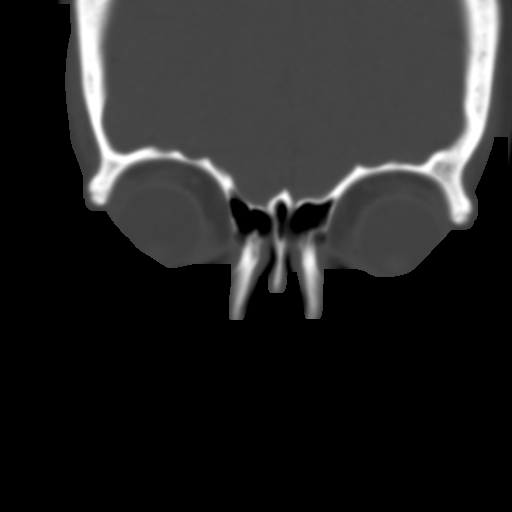
[im 9/12  bone]
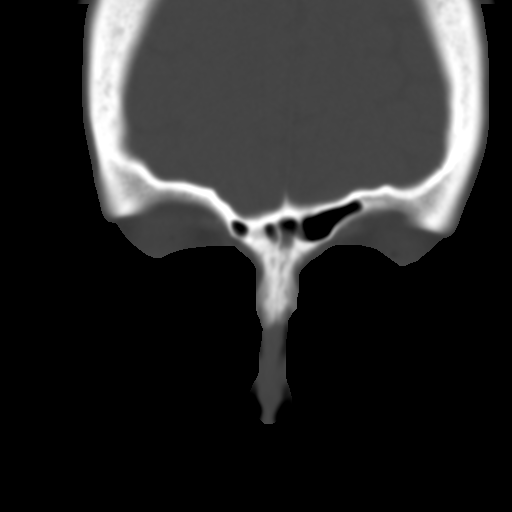
[im 10/12  brain]
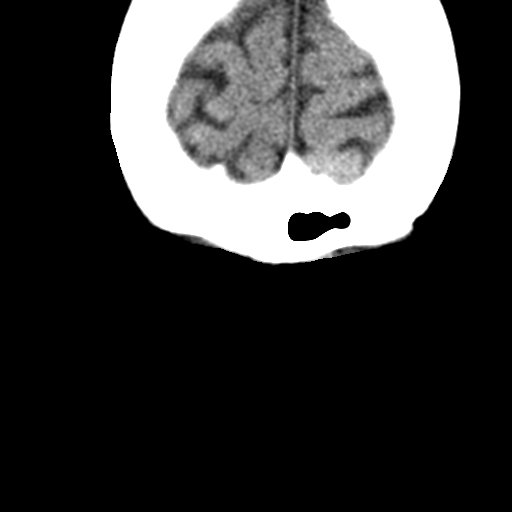
[im 10/12  bone]
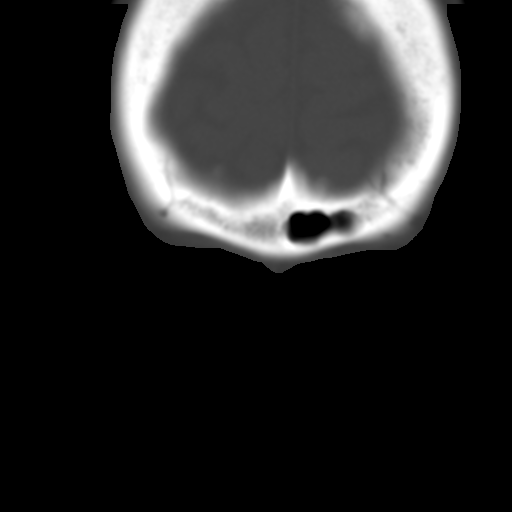
[im 11/12  bone]
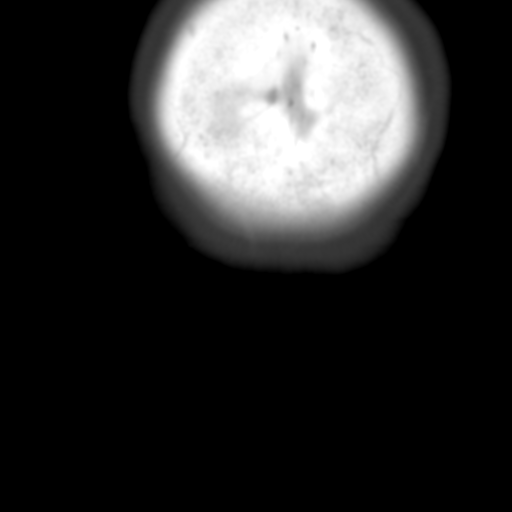

[10 of 12 positions shown; findings below may reference images not displayed]

FINDINGS: Paranasal sinuses are now clear.  Underdeveloped frontal
sinuses again noted.  No air fluid levels or significant mucosal
thickening.
IMPRESSION: Clear sinuses.

## 2013-07-14 ENCOUNTER — Encounter: Payer: Self-pay | Admitting: Family Medicine

## 2013-07-14 ENCOUNTER — Ambulatory Visit (INDEPENDENT_AMBULATORY_CARE_PROVIDER_SITE_OTHER): Payer: 59 | Admitting: Family Medicine

## 2013-07-14 VITALS — BP 117/73 | HR 89 | Temp 97.9°F | Wt 166.0 lb

## 2013-07-14 DIAGNOSIS — F411 Generalized anxiety disorder: Secondary | ICD-10-CM

## 2013-07-14 DIAGNOSIS — R5381 Other malaise: Secondary | ICD-10-CM

## 2013-07-14 DIAGNOSIS — E119 Type 2 diabetes mellitus without complications: Secondary | ICD-10-CM

## 2013-07-14 DIAGNOSIS — IMO0001 Reserved for inherently not codable concepts without codable children: Secondary | ICD-10-CM

## 2013-07-14 LAB — CBC WITH DIFFERENTIAL/PLATELET
Basophils Absolute: 0 10*3/uL (ref 0.0–0.1)
Basophils Relative: 0 % (ref 0–1)
Eosinophils Absolute: 0.4 10*3/uL (ref 0.0–0.7)
Eosinophils Relative: 4 % (ref 0–5)
HCT: 44.5 % (ref 36.0–46.0)
Hemoglobin: 15.8 g/dL — ABNORMAL HIGH (ref 12.0–15.0)
Lymphocytes Relative: 29 % (ref 12–46)
Lymphs Abs: 3 10*3/uL (ref 0.7–4.0)
MCH: 31.1 pg (ref 26.0–34.0)
MCHC: 35.5 g/dL (ref 30.0–36.0)
MCV: 87.6 fL (ref 78.0–100.0)
Monocytes Absolute: 0.6 10*3/uL (ref 0.1–1.0)
Monocytes Relative: 6 % (ref 3–12)
Neutro Abs: 6.3 10*3/uL (ref 1.7–7.7)
Neutrophils Relative %: 61 % (ref 43–77)
Platelets: 401 10*3/uL — ABNORMAL HIGH (ref 150–400)
RBC: 5.08 MIL/uL (ref 3.87–5.11)
RDW: 13.5 % (ref 11.5–15.5)
WBC: 10.3 10*3/uL (ref 4.0–10.5)

## 2013-07-14 LAB — COMPLETE METABOLIC PANEL WITH GFR
ALT: 21 U/L (ref 0–35)
AST: 18 U/L (ref 0–37)
Albumin: 4.3 g/dL (ref 3.5–5.2)
Alkaline Phosphatase: 78 U/L (ref 39–117)
BUN: 8 mg/dL (ref 6–23)
CO2: 26 mEq/L (ref 19–32)
Calcium: 9.6 mg/dL (ref 8.4–10.5)
Chloride: 100 mEq/L (ref 96–112)
Creat: 0.57 mg/dL (ref 0.50–1.10)
GFR, Est African American: >89 mL/min
GFR, Est Non African American: >89 mL/min
Glucose, Bld: 124 mg/dL — ABNORMAL HIGH (ref 70–99)
Potassium: 4.1 mEq/L (ref 3.5–5.3)
Sodium: 137 mEq/L (ref 135–145)
Total Bilirubin: 0.3 mg/dL (ref 0.3–1.2)
Total Protein: 6.4 g/dL (ref 6.0–8.3)

## 2013-07-14 LAB — TSH: TSH: 1.582 u[IU]/mL (ref 0.350–4.500)

## 2013-07-14 LAB — T4, FREE: Free T4: 1 ng/dL (ref 0.80–1.80)

## 2013-07-14 LAB — SEDIMENTATION RATE: Sed Rate: 4 mm/hr (ref 0–22)

## 2013-07-14 LAB — HEMOGLOBIN A1C
Hgb A1c MFr Bld: 6.6 % — ABNORMAL HIGH (ref <5.7)
Mean Plasma Glucose: 143 mg/dL — ABNORMAL HIGH (ref <117)

## 2013-07-14 LAB — T3, FREE: T3, Free: 2.4 pg/mL (ref 2.3–4.2)

## 2013-07-14 MED ORDER — CYANOCOBALAMIN 1000 MCG/ML IJ SOLN
1000.0000 ug | Freq: Once | INTRAMUSCULAR | Status: AC
  Administered 2013-07-14: 1000 ug via INTRAMUSCULAR

## 2013-07-14 MED ORDER — DULOXETINE HCL 30 MG PO CPEP: 90.0000 mg | ORAL_CAPSULE | Freq: Every day | ORAL | Status: DC

## 2013-07-14 MED ORDER — SITAGLIPTIN PHOS-METFORMIN HCL 50-1000 MG PO TABS: 1.0000 | ORAL_TABLET | Freq: Two times a day (BID) | ORAL | Status: DC

## 2013-07-14 NOTE — Progress Notes (Signed)
  Subjective:    Patient ID: Erin Ramsey, female    DOB: [birth year], 51 y.o.   MRN: [id number]  HPI  Erin Ramsey is here today complaining of fatigue and body aches.  She has not been feeling well for the past two weeks.  This past weekend she developed a cough and headache.  She has been taking Advil and Mucinex for her symptoms which have not provided much relief.     Review of Systems  Constitutional: Positive for fatigue.  Eyes: Negative.   Respiratory: Positive for cough.   Cardiovascular: Negative.   Gastrointestinal: Negative.   Endocrine: Negative.   Genitourinary: Negative.   Musculoskeletal: Positive for arthralgias.  Skin: Negative.   Neurological: Positive for headaches.  Hematological: Negative.   Psychiatric/Behavioral: Positive for sleep disturbance.     Past Medical History  Diagnosis Date  . Diabetes mellitus     type 2  . Hyperlipemia   . Hypertension   . Migraine   . Barrett's esophagus   . Colon polyps   . Esophageal stricture   . Allergic rhinitis   . Asthma   . Kidney stone   . Bladder spasms   . GERD (gastroesophageal reflux disease)      Family History  Problem Relation Age of Onset  . COPD Mother   . Diabetes type II Mother   . Heart failure Mother   . Hyperlipidemia Mother   . 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 Constitutional: She is oriented to person, place, and time. She appears well-developed and well-nourished.  Cardiovascular: Normal rate and regular rhythm.   Pulmonary/Chest: Effort normal.  Neurological: She is alert and oriented to person, place, and  time.  Skin: Skin is warm.  Psychiatric: She has a normal mood and affect.      Assessment & Plan:   Erin Ramsey was seen today for fatigue and myalgia.    Diagnoses and associated orders for this visit:  Other malaise and fatigue - CBC w/Diff - TSH - T3, free - T4, free - cyanocobalamin ((VITAMIN B-12)) injection 1,000 mcg; Inject 1 mL (1,000 mcg total) into the muscle once.  Myalgia and myositis - Sed Rate (ESR) - DULoxetine (CYMBALTA) 30 MG capsule; Take 3 capsules (90 mg total) by mouth daily.  Anxiety state, unspecified - DULoxetine (CYMBALTA) 30 MG capsule; Take 3 capsules (90 mg total) by mouth daily.  Type II or unspecified type diabetes mellitus without mention of complication, not stated as uncontrolled - COMPLETE METABOLIC PANEL WITH GFR - sitaGLIPtin-metformin (JANUMET) 50-1000 MG per tablet; Take 1 tablet by mouth 2 (two) times daily with a meal. - Hemoglobin A1c  TIME SPENT "FACE TO FACE" WITH PATIENT -  30 MINS

## 2013-07-24 ENCOUNTER — Encounter: Payer: Self-pay | Admitting: Family Medicine

## 2013-07-24 ENCOUNTER — Ambulatory Visit (INDEPENDENT_AMBULATORY_CARE_PROVIDER_SITE_OTHER): Payer: 59 | Admitting: Family Medicine

## 2013-07-24 VITALS — BP 109/69 | HR 88 | Resp 16 | Wt 170.0 lb

## 2013-07-24 DIAGNOSIS — B373 Candidiasis of vulva and vagina: Secondary | ICD-10-CM

## 2013-07-24 DIAGNOSIS — J329 Chronic sinusitis, unspecified: Secondary | ICD-10-CM | POA: Insufficient documentation

## 2013-07-24 DIAGNOSIS — B3731 Acute candidiasis of vulva and vagina: Secondary | ICD-10-CM | POA: Insufficient documentation

## 2013-07-24 MED ORDER — METHYLPREDNISOLONE SODIUM SUCC 125 MG IJ SOLR
125.0000 mg | Freq: Once | INTRAMUSCULAR | Status: AC
  Administered 2013-07-24: 125 mg via INTRAMUSCULAR

## 2013-07-24 MED ORDER — FLUCONAZOLE 200 MG PO TABS: ORAL_TABLET | ORAL | Status: DC

## 2013-07-24 MED ORDER — KETOROLAC TROMETHAMINE 60 MG/2ML IM SOLN
60.0000 mg | Freq: Once | INTRAMUSCULAR | Status: AC
  Administered 2013-07-24: 60 mg via INTRAMUSCULAR

## 2013-07-24 MED ORDER — TERCONAZOLE 0.8 % VA CREA: 1.0000 | TOPICAL_CREAM | Freq: Every day | VAGINAL | Status: AC

## 2013-07-24 MED ORDER — AMOXICILLIN-POT CLAVULANATE ER 1000-62.5 MG PO TB12: 1.0000 | ORAL_TABLET | Freq: Two times a day (BID) | ORAL | Status: AC

## 2013-07-24 MED ORDER — LEVALBUTEROL HCL 1.25 MG/3ML IN NEBU: 1.2500 mg | INHALATION_SOLUTION | Freq: Four times a day (QID) | RESPIRATORY_TRACT | Status: AC | PRN

## 2013-07-24 NOTE — Patient Instructions (Signed)
1)  Sinus Symptoms - Take the Augmentin XR twice a day for at least 10-14 days.  If you feel perfect after this then you can stop it.  Since we don't have a confirm sinus infection like we did back in 09/2011, you don't necessarily need the full 30-45 days like you took before.  F/U with Dr. Christell Constant and discuss sinus surgery with him.   2)  Yeast Infection - I wrote the Diflucan for 200 mg to take every other day for 7 days and asked them to give you 4.  Honestly, you may only need one so play it by ear.  You can do the Terazol cream for 3 days.     Sinusitis Sinusitis is redness, soreness, and swelling (inflammation) of the paranasal sinuses. Paranasal sinuses are air pockets within the bones of your face (beneath the eyes, the middle of the forehead, or above the eyes). In healthy paranasal sinuses, mucus is able to drain out, and air is able to circulate through them by way of your nose. However, when your paranasal sinuses are inflamed, mucus and air can become trapped. This can allow bacteria and other germs to grow and cause infection. Sinusitis can develop quickly and last only a short time (acute) or continue over a long period (chronic). Sinusitis that lasts for more than 12 weeks is considered chronic.  CAUSES  Causes of sinusitis include:  Allergies.  Structural abnormalities, such as displacement of the cartilage that separates your nostrils (deviated septum), which can decrease the air flow through your nose and sinuses and affect sinus drainage.  Functional abnormalities, such as when the small hairs (cilia) that line your sinuses and help remove mucus do not work properly or are not present. SYMPTOMS  Symptoms of acute and chronic sinusitis are the same. The primary symptoms are pain and pressure around the affected sinuses. Other symptoms include:  Upper toothache.  Earache.  Headache.  Bad breath.  Decreased sense of smell and taste.  A cough, which worsens when you are  lying flat.  Fatigue.  Fever.  Thick drainage from your nose, which often is green and may contain pus (purulent).  Swelling and warmth over the affected sinuses. DIAGNOSIS  Your caregiver will perform a physical exam. During the exam, your caregiver may:  Look in your nose for signs of abnormal growths in your nostrils (nasal polyps).  Tap over the affected sinus to check for signs of infection.  View the inside of your sinuses (endoscopy) with a special imaging device with a light attached (endoscope), which is inserted into your sinuses. If your caregiver suspects that you have chronic sinusitis, one or more of the following tests may be recommended:  Allergy tests.  Nasal culture A sample of mucus is taken from your nose and sent to a lab and screened for bacteria.  Nasal cytology A sample of mucus is taken from your nose and examined by your caregiver to determine if your sinusitis is related to an allergy. TREATMENT  Most cases of acute sinusitis are related to a viral infection and will resolve on their own within 10 days. Sometimes medicines are prescribed to help relieve symptoms (pain medicine, decongestants, nasal steroid sprays, or saline sprays).  However, for sinusitis related to a bacterial infection, your caregiver will prescribe antibiotic medicines. These are medicines that will help kill the bacteria causing the infection.  Rarely, sinusitis is caused by a fungal infection. In theses cases, your caregiver will prescribe antifungal medicine. For  some cases of chronic sinusitis, surgery is needed. Generally, these are cases in which sinusitis recurs more than 3 times per year, despite other treatments. HOME CARE INSTRUCTIONS   Drink plenty of water. Water helps thin the mucus so your sinuses can drain more easily.  Use a humidifier.  Inhale steam 3 to 4 times a day (for example, sit in the bathroom with the shower running).  Apply a warm, moist washcloth to your  face 3 to 4 times a day, or as directed by your caregiver.  Use saline nasal sprays to help moisten and clean your sinuses.  Take over-the-counter or prescription medicines for pain, discomfort, or fever only as directed by your caregiver. SEEK IMMEDIATE MEDICAL CARE IF:  You have increasing pain or severe headaches.  You have nausea, vomiting, or drowsiness.  You have swelling around your face.  You have vision problems.  You have a stiff neck.  You have difficulty breathing. MAKE SURE YOU:   Understand these instructions.  Will watch your condition.  Will get help right away if you are not doing well or get worse. Document Released: 08/28/2005 Document Revised: 11/20/2011 Document Reviewed: 09/12/2011 [facility] Patient Information 2014 [place], [place].

## 2013-07-24 NOTE — Assessment & Plan Note (Signed)
She usually gets a yeast infection when she takes an antibiotic so she was given prescriptions for Diflucan and Terazol.

## 2013-07-24 NOTE — Assessment & Plan Note (Signed)
We had a long discussion about what we could/should do for her.  I suppose the most appropriate thing would be to get another CT of the sinuses to confirm that she has opacification of her sinuses.  She was evaluated by Dr. Christell Constant (ENT) in the past who recommended against getting so many CT scans of her brain which is understandable.  Erin Ramsey did very well back in 2012 when we treated her for 6 weeks with Augmentin XR.  She also does well with steroid treatments (injection & pills).  She is very anxious to be better because her daughter Erin Ramsey is going for additional GI surgery early next week.  We discussed the fact that she might consider seeing Dr. Christell Constant again and discuss the possibility of proceeding with sinus surgery which is something that he has suggested in the past.  We gave her injections of Solu-Medrol and Toradol and will treat her with Augmentin XR for at least 10 - 14 days.

## 2013-07-24 NOTE — Progress Notes (Signed)
Subjective:    Patient ID: Erin Ramsey, female    DOB: 1960-09-21, 52 y.o.   MRN: [id number]  Erin Ramsey is here today because she feels that she may have another sinus infection.  She had one documented by a CT back in January of 2013.  She completed a 6 week course of Augmentin XR and felt well.  She has not had a bout of pneumonia since she completed the course of Augmentin.     Sinusitis This is a recurrent problem. The current episode started 1 to 4 weeks ago. The problem has been gradually worsening since onset. There has been no fever. The pain is mild. Associated symptoms include congestion, coughing, headaches and sinus pressure. (Her headache is more intensified on the left side of her face. ) Past treatments include oral decongestants ([employer]). The treatment provided mild relief.    Review of Systems  Constitutional: Negative.   HENT: Positive for congestion, facial swelling and sinus pressure.   Eyes: Negative.   Respiratory: Positive for cough.   Cardiovascular: Negative.   Gastrointestinal: Negative.   Endocrine: Negative.   Genitourinary: Negative.   Musculoskeletal: Negative.   Allergic/Immunologic: Negative.   Neurological: Positive for headaches.  Hematological: Negative.   Psychiatric/Behavioral: Negative.     Past Medical History  Diagnosis Date  . Diabetes mellitus     type 2  . Hyperlipemia   . Hypertension   . Migraine   . Barrett's esophagus   . Colon polyps   . Esophageal stricture   . Allergic rhinitis   . Asthma   . Kidney stone   . Bladder spasms   . GERD (gastroesophageal reflux disease)   . Pneumonia 2012    3 Episodes in 2012  . Chronic sinusitis   . Anxiety   . Depression      Past Surgical History  Procedure Laterality Date  . Total vaginal hysterectomy    . Cholecystectomy    . Appendectomy    . Tonsillectomy      Family History  Problem Relation Age of Onset  . COPD Mother   . Diabetes type II Mother   . Heart  failure Mother   . Hyperlipidemia Mother   . 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 Constitutional: She appears well-nourished. No distress.  HENT:  Head: Normocephalic.  Mouth/Throat: No oropharyngeal exudate.  Nasal turbinates are swollen L >R   Eyes: Conjunctivae are normal. Right eye exhibits no discharge. Left eye exhibits no discharge.  Neck: Neck supple.  Cardiovascular: Normal rate, regular rhythm and normal heart sounds.  Exam reveals no gallop and no friction rub.   No murmur heard. Pulmonary/Chest: Effort normal and breath sounds normal. She has no wheezes. She exhibits no tenderness.  Lymphadenopathy:    She has no cervical adenopathy.  Neurological: She is alert.  Skin: Skin is warm and dry. No rash noted.  Psychiatric: She has a normal mood and affect.      Assessment &  Plan:

## 2013-07-25 ENCOUNTER — Ambulatory Visit: Payer: Self-pay | Admitting: Family Medicine

## 2013-08-10 DIAGNOSIS — Z23 Encounter for immunization: Secondary | ICD-10-CM | POA: Insufficient documentation

## 2013-08-10 NOTE — Assessment & Plan Note (Signed)
The patient confirmed that they are not allergic to eggs and have never had a bad reaction with the flu shot in the past.  The vaccination was given without difficulty.   

## 2013-08-17 ENCOUNTER — Encounter: Payer: Self-pay | Admitting: Family Medicine

## 2013-08-17 DIAGNOSIS — F411 Generalized anxiety disorder: Secondary | ICD-10-CM | POA: Insufficient documentation

## 2013-08-17 DIAGNOSIS — Z0000 Encounter for general adult medical examination without abnormal findings: Secondary | ICD-10-CM | POA: Insufficient documentation

## 2013-08-17 DIAGNOSIS — D649 Anemia, unspecified: Secondary | ICD-10-CM | POA: Insufficient documentation

## 2013-09-14 ENCOUNTER — Emergency Department (INDEPENDENT_AMBULATORY_CARE_PROVIDER_SITE_OTHER): Payer: 59

## 2013-09-14 ENCOUNTER — Emergency Department (INDEPENDENT_AMBULATORY_CARE_PROVIDER_SITE_OTHER)
Admission: EM | Admit: 2013-09-14 | Discharge: 2013-09-14 | Disposition: A | Discharge status: Home / Self Care | Payer: 59 | Source: Home / Self Care | Attending: Family Medicine | Admitting: Family Medicine

## 2013-09-14 ENCOUNTER — Encounter: Payer: Self-pay | Admitting: Emergency Medicine

## 2013-09-14 DIAGNOSIS — R05 Cough: Secondary | ICD-10-CM

## 2013-09-14 DIAGNOSIS — Z8701 Personal history of pneumonia (recurrent): Secondary | ICD-10-CM

## 2013-09-14 DIAGNOSIS — J45909 Unspecified asthma, uncomplicated: Secondary | ICD-10-CM

## 2013-09-14 DIAGNOSIS — J069 Acute upper respiratory infection, unspecified: Secondary | ICD-10-CM

## 2013-09-14 DIAGNOSIS — R5383 Other fatigue: Principal | ICD-10-CM

## 2013-09-14 DIAGNOSIS — IMO0001 Reserved for inherently not codable concepts without codable children: Secondary | ICD-10-CM | POA: Insufficient documentation

## 2013-09-14 DIAGNOSIS — R059 Cough, unspecified: Secondary | ICD-10-CM

## 2013-09-14 DIAGNOSIS — R5381 Other malaise: Secondary | ICD-10-CM | POA: Insufficient documentation

## 2013-09-14 LAB — POCT CBC W AUTO DIFF (K'VILLE URGENT CARE)

## 2013-09-14 MED ORDER — LEVALBUTEROL TARTRATE 45 MCG/ACT IN AERO: INHALATION_SPRAY | RESPIRATORY_TRACT | Status: AC

## 2013-09-14 MED ORDER — DOXYCYCLINE HYCLATE 100 MG PO CAPS: 100.0000 mg | ORAL_CAPSULE | Freq: Two times a day (BID) | ORAL | Status: DC

## 2013-09-14 NOTE — Discharge Instructions (Signed)
Take plain Mucinex (1200 mg guaifenesin) twice daily for cough and congestion.  Increase fluid intake, rest. May take Delsym Cough Suppressant at bedtime for nighttime cough.  Stop all antihistamines for now, and other non-prescription cough/cold preparations. Stop Augmentin.  Continue Dulera.  Finish prednisone.   Follow-up with family doctor if not improving 7 to 10 days.

## 2013-09-14 NOTE — ED Notes (Signed)
Patient c/o productive cough, wheezing, congestion and rhinitis x 1 wk. Patient has tried OTC Mucinex, Rx Prednisone, and Augmetin without relief.

## 2013-09-14 NOTE — ED Provider Notes (Signed)
CSN: [id number]     Arrival date & time 09/14/13  1126 History   First MD Initiated Contact with Patient 09/14/13 1144     Chief Complaint  Patient presents with  . Cough  . Wheezing     HPI Comments: Patient reports that she developed a URI about 8 days ago with sinus congestion, sore throat, cough, and fever.  She underwent a sinuplasty 4 days ago, and has been on Augmentin 1000mg  bid.  She was started on Prednisone 5mg  once daily by her ENT for 5 days (with 3 doses left).  She notes that her sinus congestion is much better after her sinus procedure, but she complains of persistent cough with wheezing and shortness of breath.  She uses a Xopenex MDI with improvement in wheezing.  She has had sweats today. She has a history of pneumonia.  The history is provided by the patient.    Past Medical History  Diagnosis Date  . Hyperlipemia   . Hypertension   . Migraine   . Barrett's esophagus   . Colon polyps   . Esophageal stricture   . Allergic rhinitis   . Asthma   . Kidney stone   . Bladder spasms   . GERD (gastroesophageal reflux disease)   . Pneumonia 2012    3 Episodes in 2012  . Chronic sinusitis   . Anxiety   . Depression   . Type II or unspecified type diabetes mellitus without mention of complication, not stated as uncontrolled    Past Surgical History  Procedure Laterality Date  . Total vaginal hysterectomy    . Cholecystectomy    . Appendectomy    . Tonsillectomy     Family History  Problem Relation Age of Onset  . COPD Mother   . Diabetes type II Mother   . Heart failure Mother   . Hyperlipidemia Mother   . Brain cancer Father    History  Substance Use Topics  . Smoking status: Never Smoker   . Smokeless tobacco: Never Used  . Alcohol Use: No   OB History   Grav Para Term Preterm Abortions TAB SAB Ect Mult Living                 Review of Systems No sore throat at present + cough No pleuritic pain + wheezing + nasal congestion + post-nasal  drainage No sinus pain/pressure No itchy/red eyes No earache No hemoptysis + SOB No fever/chills at present No nausea No vomiting No abdominal pain No diarrhea No urinary symptoms No skin rash + fatigue No myalgias + headache Used OTC meds without relief  Allergies  Ceclor; Erythromycin; and Sulfa antibiotics  Home Medications   Current Outpatient Rx  Name  Route  Sig  Dispense  Refill  . benzonatate (TESSALON) 200 MG capsule   Oral   Take 1 capsule (200 mg total) by mouth 3 (three) times daily as needed for cough.   60 capsule   0   . Butalbital-Acetaminophen 50-300 MG TABS   Oral   Take 1-2 tablets by mouth 2 (two) times daily.   30 tablet   1   . Candesartan Cilexetil-HCTZ (ATACAND HCT) 32-25 MG TABS   Oral   Take 1 tablet by mouth daily.         Marland Kitchen doxycycline (VIBRAMYCIN) 100 MG capsule   Oral   Take 1 capsule (100 mg total) by mouth 2 (two) times daily.   20 capsule   0   .  DULoxetine (CYMBALTA) 30 MG capsule   Oral   Take 3 capsules (90 mg total) by mouth daily.   90 capsule   0   . escitalopram (LEXAPRO) 5 MG tablet   Oral   Take 1 tablet (5 mg total) by mouth daily.   30 tablet   5   . esomeprazole (NEXIUM) 40 MG capsule   Oral   Take 1 capsule (40 mg total) by mouth daily.   30 capsule   11   . estradiol (VIVELLE-DOT) 0.1 MG/24HR   Transdermal   Place 1 patch (0.1 mg total) onto the skin 2 (two) times a week.   24 patch   3   . Fe Fum-FA-B Cmp-C-Zn-Mg-Mn-Cu (HEMATINIC PLUS VIT/MINERALS) 106-1 MG TABS   Oral   Take 1 tablet by mouth daily.   90 each   3   . fluconazole (DIFLUCAN) 200 MG tablet      Take 1 tab po QOD for 7 days   4 tablet   0   . fluticasone (FLONASE) 50 MCG/ACT nasal spray               . glucose blood (ACCU-CHEK AVIVA PLUS) test strip      Use as directed   100 each   3   . hydrOXYzine (VISTARIL) 25 MG capsule      Take 1 capsule daily as needed   30 capsule   2   . hyoscyamine (LEVSIN SL)  0.125 MG SL tablet               . indomethacin (INDOCIN SR) 75 MG CR capsule   Oral   Take 75 mg by mouth daily as needed.          . levalbuterol (XOPENEX HFA) 45 MCG/ACT inhaler      Inhale 2 puffs into the lungs every 4 to 6 hours as needed   1 Inhaler   1   . levalbuterol (XOPENEX) 1.25 MG/3ML nebulizer solution   Nebulization   Take 1.25 mg by nebulization every 6 (six) hours as needed.   72 mL   11     Please give one box.   . metFORMIN (GLUCOPHAGE) 1000 MG tablet   Oral   Take 1 tablet (1,000 mg total) by mouth 2 (two) times daily with a meal.   180 tablet   3   . Mometasone Furo-Formoterol Fum (DULERA) 200-5 MCG/ACT AERO   Inhalation   Inhale 2 puffs into the lungs 2 (two) times daily.   1 Inhaler   6   . montelukast (SINGULAIR) 10 MG tablet      Daily.         . Multiple Vitamin (MULTIVITAMIN) tablet   Oral   Take 1 tablet by mouth daily.           Marland Kitchen omega-3 acid ethyl esters (LOVAZA) 1 G capsule   Oral   Take 2 capsules (2 g total) by mouth 2 (two) times daily.   120 capsule   11   . PRESCRIPTION MEDICATION      GI Cocktail with Zantac, benadryl, hyoscamine, sodium bicarbonate - 1 tablespoon twice daily as needed         . progesterone (PROMETRIUM) 100 MG capsule   Oral   Take 1 capsule (100 mg total) by mouth at bedtime.   30 capsule   11   . promethazine (PHENERGAN) 12.5 MG tablet   Oral   Take 1 tablet (12.5  mg total) by mouth every 8 (eight) hours as needed for nausea.   60 tablet   2   . sitaGLIPtin-metformin (JANUMET) 50-1000 MG per tablet   Oral   Take 1 tablet by mouth 2 (two) times daily with a meal.   60 tablet   5   . tiZANidine (ZANAFLEX) 4 MG tablet   Oral   Take 1 tablet (4 mg total) by mouth every 8 (eight) hours as needed.   90 tablet   2   . VENTOLIN HFA 108 (90 BASE) MCG/ACT inhaler      as needed.         . Vitamin D, Ergocalciferol, (DRISDOL) 50000 UNITS CAPS   Oral   Take 50,000 Units by  mouth every 7 (seven) days.            BP 122/79  Pulse 94  Temp(Src) 98.4 F (36.9 C) (Oral)  Resp 20  Ht 5\' 3"  (1.6 m)  Wt 170 lb (77.111 kg)  BMI 30.12 kg/m2  SpO2 96% Physical Exam Nursing notes and Vital Signs reviewed. Appearance:  Patient appears stated age, and in no acute distress.  Patient is obese (BMI 30.1) Eyes:  Pupils are equal, round, and reactive to light and accomodation.  Extraocular movement is intact.  Conjunctivae are not inflamed  Ears:  Canals normal.  Tympanic membranes normal.  Nose:  Mildly congested turbinates.  No sinus tenderness.   Pharynx:  Normal Neck:  Supple.  Posterior nodes are slightly enlarged and quite tender to palpation Lungs:   Breath sounds are decreased.  Breath sounds are equal.  Chest:  Distinct tenderness to palpation over the mid-sternum.  Heart:  Regular rate and rhythm without murmurs, rubs, or gallops.  Abdomen:  Nontender without masses or hepatosplenomegaly.  Bowel sounds are present.  No CVA or flank tenderness.  Extremities:  No edema.  No calf tenderness Skin:  No rash present.   ED Course  Procedures  None    Labs Reviewed  POCT CBC W AUTO DIFF ([facility])   WBC 12.1; LY 20.3; MO 5.9; GR 73.8; Hgb 15.6; Platelets 348   Imaging Review Dg Chest 2 View  09/14/2013   CLINICAL DATA:  Cough.  EXAM: CHEST  2 VIEW  COMPARISON:  Chest x-ray 06/05/2012.  FINDINGS: Lung volumes are normal. No consolidative airspace disease. No pleural effusions. No pneumothorax. No pulmonary nodule or mass noted. Pulmonary vasculature and the cardiomediastinal silhouette are within normal limits. Multiple embolization coils project over the left upper quadrant of the abdomen.  IMPRESSION: 1.  No radiographic evidence of acute cardiopulmonary disease.   Electronically Signed   By: Trudie Reedaniel  Entrikin M.D.   On: 09/14/2013 12:18      MDM   1. Asthmatic bronchitis    Begin doxycycline.  Refill Xopenex. Finish prednisone. Take plain  Mucinex (1200 mg guaifenesin) twice daily for cough and congestion.  Increase fluid intake, rest. May take Delsym Cough Suppressant at bedtime for nighttime cough.  Stop all antihistamines for now, and other non-prescription cough/cold preparations. Stop Augmentin.  Continue Dulera.     Follow-up with family doctor if not improving 7 to 10 days.     Lattie HawStephen A Alyda Megna, MD 09/15/13 [phone]

## 2013-09-18 ENCOUNTER — Ambulatory Visit (INDEPENDENT_AMBULATORY_CARE_PROVIDER_SITE_OTHER): Payer: 59 | Admitting: Family Medicine

## 2013-09-18 ENCOUNTER — Encounter: Payer: Self-pay | Admitting: Family Medicine

## 2013-09-18 VITALS — BP 103/72 | HR 100 | Resp 16 | Wt 171.0 lb

## 2013-09-18 DIAGNOSIS — J069 Acute upper respiratory infection, unspecified: Secondary | ICD-10-CM

## 2013-09-18 DIAGNOSIS — J45909 Unspecified asthma, uncomplicated: Secondary | ICD-10-CM

## 2013-09-18 DIAGNOSIS — R059 Cough, unspecified: Secondary | ICD-10-CM

## 2013-09-18 DIAGNOSIS — R05 Cough: Secondary | ICD-10-CM

## 2013-09-18 MED ORDER — HYDROCODONE-HOMATROPINE 5-1.5 MG/5ML PO SYRP: 5.0000 mL | ORAL_SOLUTION | Freq: Four times a day (QID) | ORAL | Status: DC | PRN

## 2013-09-18 MED ORDER — BUDESONIDE-FORMOTEROL FUMARATE 160-4.5 MCG/ACT IN AERO: 2.0000 | INHALATION_SPRAY | Freq: Two times a day (BID) | RESPIRATORY_TRACT | Status: DC

## 2013-09-18 MED ORDER — HYDROCOD POLST-CHLORPHEN POLST 10-8 MG/5ML PO LQCR: 5.0000 mL | Freq: Two times a day (BID) | ORAL | Status: DC | PRN

## 2013-09-18 MED ORDER — METHYLPREDNISOLONE SODIUM SUCC 125 MG IJ SOLR
125.0000 mg | Freq: Once | INTRAMUSCULAR | Status: AC
  Administered 2013-09-18: 125 mg via INTRAMUSCULAR

## 2013-09-18 MED ORDER — MOMETASONE FURO-FORMOTEROL FUM 200-5 MCG/ACT IN AERO: 2.0000 | INHALATION_SPRAY | Freq: Two times a day (BID) | RESPIRATORY_TRACT | Status: DC

## 2013-09-18 NOTE — Progress Notes (Signed)
Subjective:    Patient ID: Erin Ramsey, female    DOB: 1961/02/03, 53 y.o.   MRN: [id number]   HPI  Erin Ramsey is here today with chest tightness.  She describes this tightness as being moderate in nature and she is not improving.  She has been sick for the past couple of weeks.  She was seen at the [facility] and was treated with doxycycline. She feels that she would benefit from a steroid shot.     Review of Systems  Constitutional: Negative for fever and chills.  HENT: Positive for congestion, postnasal drip, rhinorrhea, sinus pressure, sneezing and sore throat. Negative for ear pain, trouble swallowing and voice change.   Eyes: Negative.  Negative for itching.  Respiratory: Positive for cough, chest tightness, shortness of breath and wheezing.   Cardiovascular: Negative for chest pain.  Gastrointestinal: Negative.   Endocrine: Negative.   Musculoskeletal: Negative.  Negative for neck pain and neck stiffness.  Skin: Negative for rash.  Neurological: Positive for headaches.  Hematological: Negative for adenopathy.     Past Medical History  Diagnosis Date  . Hyperlipemia   . Hypertension   . Migraine   . Barrett's esophagus   . Colon polyps   . Esophageal stricture   . Allergic rhinitis   . Asthma   . Kidney stone   . Bladder spasms   . GERD (gastroesophageal reflux disease)   . Pneumonia 2012    3 Episodes in 2012  . Chronic sinusitis   . Anxiety   . Depression   . Type II or unspecified type diabetes mellitus without mention of complication, not stated as uncontrolled      Past Surgical History  Procedure Laterality Date  . Total vaginal hysterectomy    . Cholecystectomy    . Appendectomy    . Tonsillectomy    . 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 COPD Mother   . Diabetes type II Mother   . Heart failure Mother   . Hyperlipidemia Mother   . Brain cancer Father      Current Outpatient Prescriptions on File Prior to Visit  Medication Sig Dispense Refill  . Candesartan Cilexetil-HCTZ (ATACAND HCT) 32-25 MG TABS Take 1 tablet by mouth daily.      Marland Kitchen doxycycline (VIBRAMYCIN) 100 MG capsule Take 1 capsule (100 mg total) by mouth 2 (two) times daily.  20 capsule  0  . escitalopram (LEXAPRO) 5 MG tablet Take 1 tablet (5 mg total) by mouth daily.  30 tablet  5  . esomeprazole (NEXIUM) 40 MG capsule Take 1 capsule (40 mg total) by mouth daily.  30 capsule  11  . estradiol (VIVELLE-DOT) 0.1 MG/24HR Place 1 patch (0.1 mg total) onto the skin 2 (two) times a week.  24 patch  3  . Fe Fum-FA-B Cmp-C-Zn-Mg-Mn-Cu (HEMATINIC PLUS VIT/MINERALS) 106-1 MG TABS Take 1 tablet by mouth daily.  90 each  3  . fluticasone (FLONASE) 50 MCG/ACT nasal spray       . glucose blood (ACCU-CHEK AVIVA PLUS) test strip Use as directed  100 each  3  . hydrOXYzine (VISTARIL) 25 MG capsule Take 1 capsule daily as needed  30 capsule  2  . levalbuterol (XOPENEX HFA) 45 MCG/ACT inhaler Inhale 2 puffs into the lungs every 4 to 6 hours as needed  1 Inhaler  1  . levalbuterol (XOPENEX) 1.25 MG/3ML nebulizer solution Take 1.25 mg by nebulization every 6 (six) hours as needed.  72 mL  11  . metFORMIN (GLUCOPHAGE) 1000 MG tablet Take 1 tablet (1,000 mg total) by mouth 2 (two) times daily with a meal.  180 tablet  3  . Mometasone Furo-Formoterol Fum (DULERA) 200-5 MCG/ACT AERO Inhale 2 puffs into the lungs 2 (two) times daily.  1 Inhaler  6  . montelukast (SINGULAIR) 10 MG tablet Daily.      . Multiple Vitamin (MULTIVITAMIN) tablet Take 1 tablet by  mouth daily.        Marland Kitchen. omega-3 acid ethyl esters (LOVAZA) 1 G capsule Take 2 capsules (2 g total) by mouth 2 (two) times daily.  120 capsule  11  . PRESCRIPTION MEDICATION GI Cocktail with Zantac, benadryl, hyoscamine, sodium bicarbonate - 1 tablespoon twice daily as needed      . progesterone (PROMETRIUM) 100 MG capsule Take 1 capsule (100 mg total) by mouth at bedtime.  30 capsule  11  . VENTOLIN HFA 108 (90 BASE) MCG/ACT inhaler as needed.      . Vitamin D, Ergocalciferol, (DRISDOL) 50000 UNITS CAPS Take 50,000 Units by mouth every 7 (seven) days.        . benzonatate (TESSALON) 200 MG capsule Take 1 capsule (200 mg total) by mouth 3 (three) times daily as needed for cough.  60 capsule  0  . indomethacin (INDOCIN SR) 75 MG CR capsule Take 75 mg by mouth daily as needed.       . promethazine (PHENERGAN) 12.5 MG tablet Take 1 tablet (12.5 mg total) by mouth every 8 (eight) hours as needed for nausea.  60 tablet  2  . tiZANidine (ZANAFLEX) 4 MG tablet Take 1 tablet (4 mg total) by mouth every 8 (eight) hours as needed.  90 tablet  2   No current facility-administered medications on file prior to visit.     Allergies  Allergen Reactions  . Ceclor [Cefaclor]     rash  . Erythromycin     Upset stomach   . Sulfa Antibiotics      Immunization History  Administered Date(s) Administered  . Influenza Whole 06/30/2011  . Influenza,inj,Quad PF,36+ Mos 06/05/2013  . Pneumococcal Polysaccharide-23 06/30/2011      Objective:   Physical Exam  Vitals reviewed. Constitutional: She appears well-nourished. No distress.  HENT:  Head: Normocephalic.  Mouth/Throat: No oropharyngeal exudate.  Eyes: Conjunctivae are normal. Right eye exhibits no discharge. Left eye exhibits no discharge.  Neck: Neck supple.  Cardiovascular: Normal rate, regular rhythm and normal heart sounds.  Exam reveals no gallop and no friction rub.   No murmur heard. Pulmonary/Chest: Effort normal. No respiratory distress.  She has no wheezes. She has no rales. She exhibits no tenderness.  Lymphadenopathy:    She has no cervical adenopathy.  Neurological: She is alert.  Skin: Skin is warm and dry. No rash noted.  Psychiatric: She has a normal mood and affect.      Assessment &  Plan:    Erin Ramsey was seen today for asthma.  Diagnoses and associated orders for this visit:  Asthma, chronic Comments: We'll start by increasing her Dulera to 200 mcg.  If she does not improve, she will also try the Symbicort.   - mometasone-formoterol (DULERA) 200-5 MCG/ACT AERO; Inhale 2 puffs into the lungs 2 (two) times daily. - budesonide-formoterol (SYMBICORT) 160-4.5 MCG/ACT inhaler; Inhale 2 puffs into the lungs 2 (two) times daily. - methylPREDNISolone sodium succinate (SOLU-MEDROL) 125 mg/2 mL injection 125 mg; Inject 2 mLs (125 mg total) into the muscle once.  Cough - chlorpheniramine-HYDROcodone (TUSSIONEX PENNKINETIC ER) 10-8 MG/5ML LQCR; Take 5 mLs by mouth every 12 (twelve) hours as needed. - HYDROcodone-homatropine (HYCODAN) 5-1.5 MG/5ML syrup; Take 5 mLs by mouth every 6 (six) hours as needed for cough.

## 2013-09-19 ENCOUNTER — Ambulatory Visit: Payer: Self-pay | Admitting: Family Medicine

## 2013-09-21 DIAGNOSIS — J45909 Unspecified asthma, uncomplicated: Secondary | ICD-10-CM | POA: Insufficient documentation

## 2013-09-26 ENCOUNTER — Ambulatory Visit: Payer: Self-pay | Admitting: Family Medicine

## 2013-10-10 ENCOUNTER — Ambulatory Visit: Payer: Self-pay | Admitting: Family Medicine

## 2013-10-17 ENCOUNTER — Ambulatory Visit (INDEPENDENT_AMBULATORY_CARE_PROVIDER_SITE_OTHER): Payer: 59 | Admitting: Family Medicine

## 2013-10-17 ENCOUNTER — Encounter: Payer: Self-pay | Admitting: Family Medicine

## 2013-10-17 VITALS — BP 99/68 | HR 75 | Resp 16 | Wt 169.0 lb

## 2013-10-17 DIAGNOSIS — R059 Cough, unspecified: Secondary | ICD-10-CM

## 2013-10-17 DIAGNOSIS — E119 Type 2 diabetes mellitus without complications: Secondary | ICD-10-CM

## 2013-10-17 DIAGNOSIS — K219 Gastro-esophageal reflux disease without esophagitis: Secondary | ICD-10-CM

## 2013-10-17 DIAGNOSIS — J45909 Unspecified asthma, uncomplicated: Secondary | ICD-10-CM

## 2013-10-17 DIAGNOSIS — R05 Cough: Secondary | ICD-10-CM

## 2013-10-17 DIAGNOSIS — E559 Vitamin D deficiency, unspecified: Secondary | ICD-10-CM

## 2013-10-17 DIAGNOSIS — N951 Menopausal and female climacteric states: Secondary | ICD-10-CM

## 2013-10-17 DIAGNOSIS — R110 Nausea: Secondary | ICD-10-CM

## 2013-10-17 DIAGNOSIS — I10 Essential (primary) hypertension: Secondary | ICD-10-CM

## 2013-10-17 LAB — POCT GLYCOSYLATED HEMOGLOBIN (HGB A1C): Hemoglobin A1C: 6.3

## 2013-10-17 MED ORDER — CANDESARTAN CILEXETIL-HCTZ 32-25 MG PO TABS: 1.0000 | ORAL_TABLET | Freq: Every day | ORAL | Status: DC

## 2013-10-17 MED ORDER — HYDROCODONE-HOMATROPINE 5-1.5 MG/5ML PO SYRP: 5.0000 mL | ORAL_SOLUTION | Freq: Four times a day (QID) | ORAL | Status: DC | PRN

## 2013-10-17 MED ORDER — PROMETHAZINE HCL 12.5 MG PO TABS: 12.5000 mg | ORAL_TABLET | Freq: Three times a day (TID) | ORAL | Status: DC | PRN

## 2013-10-17 MED ORDER — MONTELUKAST SODIUM 10 MG PO TABS: 10.0000 mg | ORAL_TABLET | Freq: Every day | ORAL | Status: AC

## 2013-10-17 MED ORDER — VITAMIN D (ERGOCALCIFEROL) 1.25 MG (50000 UNIT) PO CAPS: 50000.0000 [IU] | ORAL_CAPSULE | ORAL | Status: AC

## 2013-10-17 MED ORDER — ESOMEPRAZOLE MAGNESIUM 40 MG PO CPDR: 40.0000 mg | DELAYED_RELEASE_CAPSULE | Freq: Two times a day (BID) | ORAL | Status: AC

## 2013-10-17 MED ORDER — PROGESTERONE MICRONIZED 200 MG PO CAPS: 200.0000 mg | ORAL_CAPSULE | Freq: Every day | ORAL | Status: AC

## 2013-10-17 MED ORDER — PROMETHAZINE HCL 12.5 MG PO TABS: 12.5000 mg | ORAL_TABLET | Freq: Three times a day (TID) | ORAL | Status: AC | PRN

## 2013-10-17 NOTE — Progress Notes (Signed)
   Subjective:    Patient ID: Erin Ramsey, female    DOB: [birth year], 53 y.o.   MRN: [id number]  HPI  Erin Ramsey is here today to discuss the conditions listed below:   1) Type II DM - She is currently taking metformin and Januvia (samples).  She needs her A1c rechecked.    2)  Hypertension - Her blood pressure is well controlled with her Atacand and she needs a refill on it.   3)  GERD - Her acid reflux is well controlled with Nexium.  She needs a refill on it.   4)  Hot Flashes - She has been struggling with this problem for several weeks.  She feels that she may benefit from a higher dose of Prometrium.      Review of Systems  Constitutional: Negative for activity change, appetite change, fatigue and unexpected weight change.  HENT: Negative.   Eyes: Negative.   Respiratory: Negative for chest tightness and shortness of breath.   Cardiovascular: Negative for chest pain, palpitations and leg swelling.  Gastrointestinal: Negative for diarrhea and constipation.  Endocrine: Positive for heat intolerance. Negative for polydipsia, polyphagia and polyuria.  Genitourinary: Negative for urgency, frequency and difficulty urinating.  Musculoskeletal: Negative.   Skin: Negative.   Neurological: Negative.  Negative for light-headedness and numbness.  Hematological: Negative for adenopathy. Does not bruise/bleed easily.  Psychiatric/Behavioral: Negative for sleep disturbance and dysphoric mood. The patient is not nervous/anxious.        Objective:   Physical Exam  Vitals reviewed. Constitutional: She is oriented to person, place, and time.  Eyes: Conjunctivae are normal. No scleral icterus.  Neck: Neck supple. No thyromegaly present.  Cardiovascular: Normal rate, regular rhythm and normal heart sounds.   Pulmonary/Chest: Effort normal and breath sounds normal.  Musculoskeletal: She exhibits no edema and no tenderness.  Lymphadenopathy:    She has no cervical adenopathy.  Neurological:  She is alert and oriented to person, place, and time.  Skin: Skin is warm and dry.  Psychiatric: She has a normal mood and affect. Her behavior is normal. Judgment and thought content normal.      Assessment & Plan:    Erin Ramsey was seen today for medication management.  Diagnoses and associated orders for this visit:  Type II or unspecified type diabetes mellitus without mention of complication, not stated as uncontrolled - POCT glycosylated hemoglobin (Hb A1C) 6.3%   GERD (gastroesophageal reflux disease) - esomeprazole (NEXIUM) 40 MG capsule; Take 1 capsule (40 mg total) by mouth 2 (two) times daily before a meal.  Menopause syndrome - progesterone (PROMETRIUM) 200 MG capsule; Take 1 capsule (200 mg total) by mouth at bedtime.  Nausea alone Comments: She was given a refill for promthazine.  - promethazine (PHENERGAN) 12.5 MG tablet; Take 1 tablet (12.5 mg total) by mouth every 8 (eight) hours as needed for nausea.  Unspecified vitamin D deficiency - Vitamin D, Ergocalciferol, (DRISDOL) 50000 UNITS CAPS capsule; Take 1 capsule (50,000 Units total) by mouth every 7 (seven) days.  Unspecified asthma(493.90) - montelukast (SINGULAIR) 10 MG tablet; Take 1 tablet (10 mg total) by mouth at bedtime.  Essential hypertension, benign - Candesartan Cilexetil-HCTZ (ATACAND HCT) 32-25 MG TABS; Take 1 tablet by mouth daily.  Cough - HYDROcodone-homatropine (HYCODAN) 5-1.5 MG/5ML syrup; Take 5 mLs by mouth every 6 (six) hours as needed for cough.  TIME SPENT "FACE TO FACE" WITH PATIENT -  45 MINS

## 2013-10-21 ENCOUNTER — Encounter: Payer: Self-pay | Admitting: *Deleted

## 2013-10-22 ENCOUNTER — Encounter: Payer: Self-pay | Admitting: Emergency Medicine

## 2013-10-22 ENCOUNTER — Emergency Department
Admission: EM | Admit: 2013-10-22 | Discharge: 2013-10-22 | Disposition: A | Discharge status: Home / Self Care | Payer: 59 | Source: Home / Self Care | Attending: Family Medicine | Admitting: Family Medicine

## 2013-10-22 DIAGNOSIS — R059 Cough, unspecified: Secondary | ICD-10-CM

## 2013-10-22 DIAGNOSIS — R69 Illness, unspecified: Principal | ICD-10-CM

## 2013-10-22 DIAGNOSIS — J111 Influenza due to unidentified influenza virus with other respiratory manifestations: Secondary | ICD-10-CM

## 2013-10-22 DIAGNOSIS — R05 Cough: Secondary | ICD-10-CM

## 2013-10-22 DIAGNOSIS — R509 Fever, unspecified: Secondary | ICD-10-CM

## 2013-10-22 MED ORDER — OSELTAMIVIR PHOSPHATE 75 MG PO CAPS
75.0000 mg | ORAL_CAPSULE | Freq: Two times a day (BID) | ORAL | Status: DC
Start: 2013-10-22 — End: 2014-01-14

## 2013-10-22 NOTE — ED Notes (Signed)
Pt reports that she is being treated for a sinus infection currently, with body aches and fever developing x today.

## 2013-10-22 NOTE — ED Provider Notes (Signed)
CSN: [id number]     Arrival date & time 10/22/13  1733 History   First MD Initiated Contact with Patient 10/22/13 1814     Chief Complaint  Patient presents with  . Generalized Body Aches  . Fever        HPI Comments: Patient was started on Augmentin one week ago for sinusitis.  Her PCP recently increased her dose of Dulera with good results. She was doing well until yesterday when she suddenly developed myalgias, chills, sore throat, fatigue, low grade fever, and increased cough.  Today she developed a headache.  The history is provided by the patient.    Past Medical History  Diagnosis Date  . Hyperlipemia   . Hypertension   . Migraine   . Barrett's esophagus   . Colon polyps   . Esophageal stricture   . Allergic rhinitis   . Asthma   . Kidney stone   . Bladder spasms   . GERD (gastroesophageal reflux disease)   . Pneumonia 2012    3 Episodes in 2012  . Chronic sinusitis   . Anxiety   . Depression   . Type II or unspecified type diabetes mellitus without mention of complication, not stated as uncontrolled    Past Surgical History  Procedure Laterality Date  . Total vaginal hysterectomy    . Cholecystectomy    . Appendectomy    . Tonsillectomy    . Nasal endoscopy w/ ballon sinuplasty     Family History  Problem Relation Age of Onset  . COPD Mother   . Diabetes type II Mother   . Heart failure Mother   . Hyperlipidemia Mother   . Brain cancer Father    History  Substance Use Topics  . Smoking status: Never Smoker   . Smokeless tobacco: Never Used  . Alcohol Use: No   OB History   Grav Para Term Preterm Abortions TAB SAB Ect Mult Living                 Review of Systems + sore throat + cough No pleuritic pain No wheezing + nasal congestion + post-nasal drainage No sinus pain/pressure No itchy/red eyes No earache No hemoptysis + SOB + fever, + chills No nausea No vomiting No abdominal pain No diarrhea No urinary symptoms No skin rash +  fatigue + myalgias + headache     Allergies  Ceclor; Erythromycin; and Sulfa antibiotics  Home Medications   Current Outpatient Rx  Name  Route  Sig  Dispense  Refill  . benzonatate (TESSALON) 200 MG capsule   Oral   Take 1 capsule (200 mg total) by mouth 3 (three) times daily as needed for cough.   60 capsule   0   . budesonide-formoterol (SYMBICORT) 160-4.5 MCG/ACT inhaler   Inhalation   Inhale 2 puffs into the lungs 2 (two) times daily.   1 Inhaler   11   . Candesartan Cilexetil-HCTZ (ATACAND HCT) 32-25 MG TABS   Oral   Take 1 tablet by mouth daily.   90 each   3   . EPIPEN 2-PAK 0.3 MG/0.3ML SOAJ injection               . escitalopram (LEXAPRO) 5 MG tablet   Oral   Take 1 tablet (5 mg total) by mouth daily.   30 tablet   5   . esomeprazole (NEXIUM) 40 MG capsule   Oral   Take 1 capsule (40 mg total) by mouth 2 (two)  times daily before a meal.   60 capsule   11   . estradiol (VIVELLE-DOT) 0.1 MG/24HR   Transdermal   Place 1 patch (0.1 mg total) onto the skin 2 (two) times a week.   24 patch   3   . Fe Fum-FA-B Cmp-C-Zn-Mg-Mn-Cu (HEMATINIC PLUS VIT/MINERALS) 106-1 MG TABS   Oral   Take 1 tablet by mouth daily.   90 each   3   . fluticasone (FLONASE) 50 MCG/ACT nasal spray               . glucose blood (ACCU-CHEK AVIVA PLUS) test strip      Use as directed   100 each   3   . HYDROcodone-homatropine (HYCODAN) 5-1.5 MG/5ML syrup   Oral   Take 5 mLs by mouth every 6 (six) hours as needed for cough.   240 mL   0   . hydrOXYzine (VISTARIL) 25 MG capsule      Take 1 capsule daily as needed   30 capsule   2   . indomethacin (INDOCIN SR) 75 MG CR capsule   Oral   Take 75 mg by mouth daily as needed.          . levalbuterol (XOPENEX HFA) 45 MCG/ACT inhaler      Inhale 2 puffs into the lungs every 4 to 6 hours as needed   1 Inhaler   1   . levalbuterol (XOPENEX) 1.25 MG/3ML nebulizer solution   Nebulization   Take 1.25 mg by  nebulization every 6 (six) hours as needed.   72 mL   11     Please give one box.   . metFORMIN (GLUCOPHAGE) 1000 MG tablet   Oral   Take 1 tablet (1,000 mg total) by mouth 2 (two) times daily with a meal.   180 tablet   3   . Mometasone Furo-Formoterol Fum (DULERA) 200-5 MCG/ACT AERO   Inhalation   Inhale 2 puffs into the lungs 2 (two) times daily.   1 Inhaler   6   . mometasone-formoterol (DULERA) 200-5 MCG/ACT AERO   Inhalation   Inhale 2 puffs into the lungs 2 (two) times daily.   1 Inhaler   11   . montelukast (SINGULAIR) 10 MG tablet   Oral   Take 1 tablet (10 mg total) by mouth at bedtime.   90 tablet   3   . Multiple Vitamin (MULTIVITAMIN) tablet   Oral   Take 1 tablet by mouth daily.           Marland Kitchen omega-3 acid ethyl esters (LOVAZA) 1 G capsule   Oral   Take 2 capsules (2 g total) by mouth 2 (two) times daily.   120 capsule   11   . oseltamivir (TAMIFLU) 75 MG capsule   Oral   Take 1 capsule (75 mg total) by mouth every 12 (twelve) hours.   10 capsule   0   . PRESCRIPTION MEDICATION      GI Cocktail with Zantac, benadryl, hyoscamine, sodium bicarbonate - 1 tablespoon twice daily as needed         . progesterone (PROMETRIUM) 200 MG capsule   Oral   Take 1 capsule (200 mg total) by mouth at bedtime.   30 capsule   11   . promethazine (PHENERGAN) 12.5 MG tablet   Oral   Take 1 tablet (12.5 mg total) by mouth every 8 (eight) hours as needed for nausea.   60 tablet  2   . tiZANidine (ZANAFLEX) 4 MG tablet   Oral   Take 1 tablet (4 mg total) by mouth every 8 (eight) hours as needed.   90 tablet   2   . VENTOLIN HFA 108 (90 BASE) MCG/ACT inhaler      as needed.         . Vitamin D, Ergocalciferol, (DRISDOL) 50000 UNITS CAPS capsule   Oral   Take 1 capsule (50,000 Units total) by mouth every 7 (seven) days.   12 capsule   3    BP 125/80  Pulse 100  Temp(Src) 98.5 F (36.9 C) (Oral)  Resp 16  Ht 5\' 2"  (1.575 m)  Wt 168 lb  (76.204 kg)  BMI 30.72 kg/m2  SpO2 95% Physical Exam Nursing notes and Vital Signs reviewed. Appearance:  Patient appears stated age, and in no acute distress Eyes:  Pupils are equal, round, and reactive to light and accomodation.  Extraocular movement is intact.  Conjunctivae are not inflamed  Ears:  Canals normal.  Tympanic membranes normal.  Nose:  Congested turbinates.  No sinus tenderness.    Pharynx:  Normal Neck:  Supple.  Slightly tender shotty posterior nodes are palpated bilaterally  Lungs:  Clear to auscultation.  Breath sounds are equal.  Heart:  Regular rate and rhythm without murmurs, rubs, or gallops.  Abdomen:  Nontender without masses or hepatosplenomegaly.  Bowel sounds are present.  No CVA or flank tenderness.  Extremities:  No edema.  No calf tenderness Skin:  No rash present.   ED Course  Procedures  none       MDM   Final diagnoses:  Influenza-like illness    Begin Tamiflu Continue all present medications. Followup with Family Doctor if not improved in about 5 days.    Lattie Haw, MD 10/23/13 1235

## 2013-10-22 NOTE — Discharge Instructions (Signed)
Continue all present medications.   Influenza, Adult Influenza ("the flu") is a viral infection of the respiratory tract. It occurs more often in winter months because people spend more time in close contact with one another. Influenza can make you feel very sick. Influenza easily spreads from person to person (contagious). CAUSES  Influenza is caused by a virus that infects the respiratory tract. You can catch the virus by breathing in droplets from an infected person's cough or sneeze. You can also catch the virus by touching something that was recently contaminated with the virus and then touching your mouth, nose, or eyes. SYMPTOMS  Symptoms typically last 4 to 10 days and may include:  Fever.  Chills.  Headache, body aches, and muscle aches.  Sore throat.  Chest discomfort and cough.  Poor appetite.  Weakness or feeling tired.  Dizziness.  Nausea or vomiting. DIAGNOSIS  Diagnosis of influenza is often made based on your history and a physical exam. A nose or throat swab test can be done to confirm the diagnosis. RISKS AND COMPLICATIONS You may be at risk for a more severe case of influenza if you smoke cigarettes, have diabetes, have chronic heart disease (such as heart failure) or lung disease (such as asthma), or if you have a weakened immune system. Elderly people and pregnant women are also at risk for more serious infections. The most common complication of influenza is a lung infection (pneumonia). Sometimes, this complication can require emergency medical care and may be life-threatening. PREVENTION  An annual influenza vaccination (flu shot) is the best way to avoid getting influenza. An annual flu shot is now routinely recommended for all adults in the [place] TREATMENT  In mild cases, influenza goes away on its own. Treatment is directed at relieving symptoms. For more severe cases, your caregiver may prescribe antiviral medicines to shorten the sickness. Antibiotic  medicines are not effective, because the infection is caused by a virus, not by bacteria. HOME CARE INSTRUCTIONS  Only take over-the-counter or prescription medicines for pain, discomfort, or fever as directed by your caregiver.  Use a cool mist humidifier to make breathing easier.  Get plenty of rest until your temperature returns to normal. This usually takes 3 to 4 days.  Drink enough fluids to keep your urine clear or pale yellow.  Cover your mouth and nose when coughing or sneezing, and wash your hands well to avoid spreading the virus.  Stay home from work or school until your fever has been gone for at least 1 full day. SEEK MEDICAL CARE IF:   You have chest pain or a deep cough that worsens or produces more mucus.  You have nausea, vomiting, or diarrhea. SEEK IMMEDIATE MEDICAL CARE IF:   You have difficulty breathing, shortness of breath, or your skin or nails turn bluish.  You have severe neck pain or stiffness.  You have a severe headache, facial pain, or earache.  You have a worsening or recurring fever.  You have nausea or vomiting that cannot be controlled. MAKE SURE YOU:  Understand these instructions.  Will watch your condition.  Will get help right away if you are not doing well or get worse. Document Released: 08/25/2000 Document Revised: 02/27/2012 Document Reviewed: 11/27/2011 [facility] Patient Information 2014 [place], [place].

## 2013-11-07 ENCOUNTER — Ambulatory Visit: Payer: Self-pay | Admitting: Family Medicine

## 2013-12-19 ENCOUNTER — Encounter: Payer: Self-pay | Admitting: *Deleted

## 2014-01-09 ENCOUNTER — Ambulatory Visit: Payer: 59 | Admitting: Family Medicine

## 2014-01-14 ENCOUNTER — Encounter: Payer: Self-pay | Admitting: Family Medicine

## 2014-01-14 ENCOUNTER — Ambulatory Visit (INDEPENDENT_AMBULATORY_CARE_PROVIDER_SITE_OTHER): Payer: 59 | Admitting: Family Medicine

## 2014-01-14 VITALS — BP 103/64 | HR 89 | Resp 16 | Wt 167.0 lb

## 2014-01-14 DIAGNOSIS — J329 Chronic sinusitis, unspecified: Secondary | ICD-10-CM | POA: Insufficient documentation

## 2014-01-14 DIAGNOSIS — J019 Acute sinusitis, unspecified: Secondary | ICD-10-CM

## 2014-01-14 DIAGNOSIS — B379 Candidiasis, unspecified: Secondary | ICD-10-CM | POA: Insufficient documentation

## 2014-01-14 MED ORDER — METHYLPREDNISOLONE SODIUM SUCC 125 MG IJ SOLR
125.0000 mg | Freq: Once | INTRAMUSCULAR | Status: AC
  Administered 2014-01-14: 125 mg via INTRAMUSCULAR

## 2014-01-14 MED ORDER — AMOXICILLIN-POT CLAVULANATE ER 1000-62.5 MG PO TB12: 2.0000 | ORAL_TABLET | Freq: Two times a day (BID) | ORAL | Status: DC

## 2014-01-14 MED ORDER — TERCONAZOLE 0.8 % VA CREA: 1.0000 | TOPICAL_CREAM | Freq: Every day | VAGINAL | Status: DC

## 2014-01-14 NOTE — Progress Notes (Signed)
Subjective:    Patient ID: Erin Ramsey, female    DOB: 05-17-1961, 53 y.o.   MRN: [id number]   Erin Ramsey is here today complaining of sinusitis.  She developed a cold 10 days ago.  5 days into her cold, she thought she was improving then she took a turn for the worse.  She is blowing thick, yellow discharge from her nose and she has moderate pain over her maxillary sinuses.     Sinusitis This is a new problem. The current episode started 1 to 4 weeks ago. The problem has been gradually worsening since onset. There has been no fever. Her pain is at a severity of 7/10. The pain is moderate. Associated symptoms include congestion, coughing, ear pain, headaches, neck pain, sinus pressure, sneezing and a sore throat. Past treatments include oral decongestants and spray decongestants. The treatment provided mild relief.     Review of Systems  HENT: Positive for congestion, ear pain, sinus pressure, sneezing and sore throat.   Respiratory: Positive for cough.   Musculoskeletal: Positive for neck pain.  Neurological: Positive for headaches.  All other systems reviewed and are negative.    Past Medical History  Diagnosis Date  . Hyperlipemia   . Hypertension   . Migraine   . Barrett's esophagus   . Colon polyps   . Esophageal stricture   . Allergic rhinitis   . Asthma   . Kidney stone   . Bladder spasms   . GERD (gastroesophageal reflux disease)   . Pneumonia 2012    3 Episodes in 2012  . Chronic sinusitis   . Anxiety   . Depression   . Type II or unspecified type diabetes mellitus without mention of complication, not stated as uncontrolled      Past Surgical History  Procedure Laterality Date  . Total vaginal hysterectomy    . Cholecystectomy    . Appendectomy    . Tonsillectomy    . 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 COPD Mother   . Diabetes type II Mother   . Heart failure Mother   . Hyperlipidemia Mother   . Brain cancer Father      Current Outpatient Prescriptions on File Prior to Visit  Medication Sig Dispense Refill  . budesonide-formoterol (SYMBICORT) 160-4.5 MCG/ACT inhaler Inhale 2 puffs into the lungs 2 (two) times daily.  1 Inhaler  11  . Candesartan Cilexetil-HCTZ (ATACAND HCT) 32-25 MG TABS Take 1 tablet by mouth daily.  90 each  3  . EPIPEN 2-PAK 0.3 MG/0.3ML SOAJ injection       . escitalopram (LEXAPRO) 5 MG tablet Take 1 tablet (5 mg total) by mouth daily.  30 tablet  5  . esomeprazole (NEXIUM) 40 MG capsule Take 1 capsule (40 mg total) by mouth 2 (two) times daily before a meal.  60 capsule  11  . estradiol (VIVELLE-DOT) 0.1 MG/24HR Place 1 patch (0.1  mg total) onto the skin 2 (two) times a week.  24 patch  3  . Fe Fum-FA-B Cmp-C-Zn-Mg-Mn-Cu (HEMATINIC PLUS VIT/MINERALS) 106-1 MG TABS Take 1 tablet by mouth daily.  90 each  3  . fluticasone (FLONASE) 50 MCG/ACT nasal spray       . glucose blood (ACCU-CHEK AVIVA PLUS) test strip Use as directed  100 each  3  . HYDROcodone-homatropine (HYCODAN) 5-1.5 MG/5ML syrup Take 5 mLs by mouth every 6 (six) hours as needed for cough.  240 mL  0  . hydrOXYzine (VISTARIL) 25 MG capsule Take 1 capsule daily as needed  30 capsule  2  . indomethacin (INDOCIN SR) 75 MG CR capsule Take 75 mg by mouth daily as needed.       . levalbuterol (XOPENEX HFA) 45 MCG/ACT inhaler Inhale 2 puffs into the lungs every 4 to 6 hours as needed  1 Inhaler  1  . levalbuterol (XOPENEX) 1.25 MG/3ML nebulizer solution Take 1.25 mg by nebulization every 6 (six) hours as needed.  72 mL  11  . metFORMIN  (GLUCOPHAGE) 1000 MG tablet Take 1 tablet (1,000 mg total) by mouth 2 (two) times daily with a meal.  180 tablet  3  . montelukast (SINGULAIR) 10 MG tablet Take 1 tablet (10 mg total) by mouth at bedtime.  90 tablet  3  . Multiple Vitamin (MULTIVITAMIN) tablet Take 1 tablet by mouth daily.        Marland Kitchen. PRESCRIPTION MEDICATION GI Cocktail with Zantac, benadryl, hyoscamine, sodium bicarbonate - 1 tablespoon twice daily as needed      . progesterone (PROMETRIUM) 200 MG capsule Take 1 capsule (200 mg total) by mouth at bedtime.  30 capsule  11  . promethazine (PHENERGAN) 12.5 MG tablet Take 1 tablet (12.5 mg total) by mouth every 8 (eight) hours as needed for nausea.  60 tablet  2  . VENTOLIN HFA 108 (90 BASE) MCG/ACT inhaler as needed.      . Vitamin D, Ergocalciferol, (DRISDOL) 50000 UNITS CAPS capsule Take 1 capsule (50,000 Units total) by mouth every 7 (seven) days.  12 capsule  3   No current facility-administered medications on file prior to visit.     Allergies  Allergen Reactions  . Ceclor [Cefaclor]     rash  . Erythromycin     Upset stomach   . Sulfa Antibiotics      Immunization History  Administered Date(s) Administered  . Influenza Whole 06/30/2011  . Influenza,inj,Quad PF,36+ Mos 06/05/2013  . Pneumococcal Polysaccharide-23 06/30/2011       Objective:   Physical Exam  Nursing note and vitals reviewed. Constitutional: She appears well-nourished. No distress.  HENT:  Head: Normocephalic.  Mouth/Throat: No oropharyngeal exudate.  Nasal turbinates are swollen L >R   Eyes: Conjunctivae are normal. Right eye exhibits no discharge. Left eye exhibits no discharge.  Neck: Neck supple.  Cardiovascular: Normal rate, regular rhythm and normal heart sounds.  Exam reveals no gallop and no friction rub.   No murmur heard. Pulmonary/Chest: Effort normal and breath sounds normal. She has no wheezes. She exhibits no tenderness.  Lymphadenopathy:    She has no cervical adenopathy.   Neurological: She is alert.  Skin: Skin is warm and dry. No rash noted.  Psychiatric: She has a normal mood and affect.      Assessment & Plan:    Erin Ramsey was seen today for sinusitis.  Diagnoses and associated orders for this visit:  Acute sinusitis Comments: Erin Ramsey has done  everything that I have taught her in the past like sinus rinse, Umcka Cold Care, Cold Eeze etc.  Her history is classic for sinusitis so we'll treat accordingly.    - amoxicillin-clavulanate (AUGMENTIN XR) 1000-62.5 MG per tablet; Take 2 tablets by mouth 2 (two) times daily. - methylPREDNISolone sodium succinate (SOLU-MEDROL) 125 mg/2 mL injection 125 mg; Inject 2 mLs (125 mg total) into the muscle once.  Candidiasis Comments: She was given meds in case she develops a yeast infection.   - terconazole (TERAZOL 3) 0.8 % vaginal cream; Place 1 applicator vaginally at bedtime.

## 2014-01-14 NOTE — Patient Instructions (Signed)
Sinusitis Sinusitis is redness, soreness, and puffiness (inflammation) of the air pockets in the bones of your face (sinuses). The redness, soreness, and puffiness can cause air and mucus to get trapped in your sinuses. This can allow germs to grow and cause an infection.  HOME CARE   Drink enough fluids to keep your pee (urine) clear or pale yellow.  Use a humidifier in your home.  Run a hot shower to create steam in the bathroom. Sit in the bathroom with the door closed. Breathe in the steam 3 4 times a day.  Put a warm, moist washcloth on your face 3 4 times a day, or as told by your doctor.  Use salt water sprays (saline sprays) to wet the thick fluid in your nose. This can help the sinuses drain.  Only take medicine as told by your doctor. GET HELP RIGHT AWAY IF:   Your pain gets worse.  You have very bad headaches.  You are sick to your stomach (nauseous).  You throw up (vomit).  You are very sleepy (drowsy) all the time.  Your face is puffy (swollen).  Your vision changes.  You have a stiff neck.  You have trouble breathing. MAKE SURE YOU:   Understand these instructions.  Will watch your condition.  Will get help right away if you are not doing well or get worse. Document Released: 02/14/2008 Document Revised: 05/22/2012 Document Reviewed: 04/02/2012 [employer] Patient Information 2014 [employer].  

## 2014-01-19 ENCOUNTER — Other Ambulatory Visit: Payer: Self-pay | Admitting: *Deleted

## 2014-01-19 ENCOUNTER — Telehealth: Payer: Self-pay

## 2014-01-19 MED ORDER — FLUTICASONE PROPIONATE 50 MCG/ACT NA SUSP: 1.0000 | Freq: Every day | NASAL | Status: DC

## 2014-01-22 NOTE — Telephone Encounter (Signed)
error 

## 2014-01-23 ENCOUNTER — Encounter: Payer: Self-pay | Admitting: Family Medicine

## 2014-01-23 ENCOUNTER — Ambulatory Visit (INDEPENDENT_AMBULATORY_CARE_PROVIDER_SITE_OTHER): Payer: 59 | Admitting: Family Medicine

## 2014-01-23 VITALS — BP 115/81 | HR 86 | Resp 16 | Wt 169.0 lb

## 2014-01-23 DIAGNOSIS — Z780 Asymptomatic menopausal state: Secondary | ICD-10-CM

## 2014-01-23 DIAGNOSIS — E119 Type 2 diabetes mellitus without complications: Secondary | ICD-10-CM

## 2014-01-23 DIAGNOSIS — J4530 Mild persistent asthma, uncomplicated: Secondary | ICD-10-CM

## 2014-01-23 DIAGNOSIS — N951 Menopausal and female climacteric states: Secondary | ICD-10-CM

## 2014-01-23 DIAGNOSIS — E785 Hyperlipidemia, unspecified: Secondary | ICD-10-CM

## 2014-01-23 DIAGNOSIS — I10 Essential (primary) hypertension: Secondary | ICD-10-CM

## 2014-01-23 DIAGNOSIS — J45909 Unspecified asthma, uncomplicated: Secondary | ICD-10-CM

## 2014-01-23 LAB — POCT GLYCOSYLATED HEMOGLOBIN (HGB A1C): Hemoglobin A1C: 6.5

## 2014-01-23 MED ORDER — INSULIN PEN NEEDLE 32G X 6 MM MISC: 1.0000 "pen " | Freq: Every day | Status: AC

## 2014-01-23 MED ORDER — CANDESARTAN CILEXETIL-HCTZ 32-25 MG PO TABS: 1.0000 | ORAL_TABLET | Freq: Every day | ORAL | Status: DC

## 2014-01-23 MED ORDER — ESTRADIOL 0.1 MG/24HR TD PTTW: 1.0000 | MEDICATED_PATCH | TRANSDERMAL | Status: DC

## 2014-01-23 MED ORDER — MOMETASONE FURO-FORMOTEROL FUM 100-5 MCG/ACT IN AERO: 2.0000 | INHALATION_SPRAY | Freq: Two times a day (BID) | RESPIRATORY_TRACT | Status: AC

## 2014-01-23 MED ORDER — METFORMIN HCL 1000 MG PO TABS: 1000.0000 mg | ORAL_TABLET | Freq: Two times a day (BID) | ORAL | Status: DC

## 2014-01-23 MED ORDER — LIRAGLUTIDE 18 MG/3ML ~~LOC~~ SOPN: 1.8000 mg | PEN_INJECTOR | Freq: Every day | SUBCUTANEOUS | Status: DC

## 2014-01-23 NOTE — Progress Notes (Signed)
Subjective:                                     Patient ID: Erin Ramsey, female    DOB: January 05, 1961, 53 y.o.   MRN: [id number]  HPI  Erin Ramsey is here today to get medication refills and to go over the following issues:  1)  Type II DM:  She is taking Janumet but does not feel well on it.   2)  Hypertension:  She is doing well on the [place].  3)  Menopause: She is doing well on the Vivelle Dot patch and the progestrone. She needs a refill on the patch.  4)  Asthma:  She needs a refill on her Dulera inhaler.    Review of Systems  Unable to perform ROS Constitutional: Negative for activity change, appetite change, fatigue and unexpected weight change.  HENT: Negative.   Eyes: Negative.   Respiratory: Negative for cough, chest tightness, shortness of breath and wheezing.   Cardiovascular: Negative for chest pain, palpitations and leg swelling.  Gastrointestinal: Negative for diarrhea and constipation.  Endocrine: Negative for polydipsia, polyphagia and polyuria.  Genitourinary: Negative for urgency, frequency and difficulty urinating.  Musculoskeletal: Negative.   Skin: Negative.   Neurological: Negative.  Negative for light-headedness and numbness.  Hematological: Negative for adenopathy. Does not bruise/bleed easily.  Psychiatric/Behavioral: Negative for behavioral problems, sleep disturbance and dysphoric mood. The patient is not nervous/anxious.   All other systems reviewed and are negative.    Past Medical History  Diagnosis Date  . Hyperlipemia   . Hypertension   . Migraine   . Barrett's esophagus   . Colon polyps   . Esophageal stricture   . Allergic rhinitis   . Asthma   . Kidney stone   . Bladder spasms   . GERD (gastroesophageal reflux disease)   . Pneumonia 2012    3 Episodes in 2012  . Chronic sinusitis   . Anxiety   . Depression   . Type II or unspecified type diabetes mellitus without mention of complication, not stated as uncontrolled      Past  Surgical History  Procedure Laterality Date  . Total vaginal hysterectomy    . Cholecystectomy    . Appendectomy    . Tonsillectomy    . 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 COPD Mother   . Diabetes type II Mother   . Heart failure Mother   . Hyperlipidemia Mother   . Brain cancer Father      Current Outpatient Prescriptions on File Prior to Visit  Medication Sig Dispense Refill  . EPIPEN 2-PAK 0.3 MG/0.3ML SOAJ injection       . esomeprazole (NEXIUM) 40 MG capsule Take 1 capsule (40 mg total) by mouth 2 (two) times daily before a meal.  60 capsule  11  .  Fe Fum-FA-B Cmp-C-Zn-Mg-Mn-Cu (HEMATINIC PLUS VIT/MINERALS) 106-1 MG TABS Take 1 tablet by mouth daily.  90 each  3  . glucose blood (ACCU-CHEK AVIVA PLUS) test strip Use as directed  100 each  3  . hydrOXYzine (VISTARIL) 25 MG capsule Take 1 capsule daily as needed  30 capsule  2  . indomethacin (INDOCIN SR) 75 MG CR capsule Take 75 mg by mouth daily as needed.       . levalbuterol (XOPENEX HFA) 45 MCG/ACT inhaler Inhale 2 puffs into the lungs every 4 to 6 hours as needed  1 Inhaler  1  . levalbuterol (XOPENEX) 1.25 MG/3ML nebulizer solution Take 1.25 mg by nebulization every 6 (six) hours as needed.  72 mL  11  . metFORMIN (GLUCOPHAGE) 1000 MG tablet Take 1 tablet (1,000 mg total) by mouth 2 (two) times daily with a meal.  180 tablet  3  . montelukast (SINGULAIR) 10 MG tablet Take 1 tablet (10 mg total) by mouth at bedtime.  90 tablet  3  . Multiple Vitamin (MULTIVITAMIN) tablet Take 1 tablet by mouth daily.          . progesterone (PROMETRIUM) 200 MG capsule Take 1 capsule (200 mg total) by mouth at bedtime.  30 capsule  11  . promethazine (PHENERGAN) 12.5 MG tablet Take 1 tablet (12.5 mg total) by mouth every 8 (eight) hours as needed for nausea.  60 tablet  2  . VENTOLIN HFA 108 (90 BASE) MCG/ACT inhaler as needed.      . Vitamin D, Ergocalciferol, (DRISDOL) 50000 UNITS CAPS capsule Take 1 capsule (50,000 Units total) by mouth every 7 (seven) days.  12 capsule  3   No current facility-administered medications on file prior to visit.     Allergies  Allergen Reactions  . Ceclor [Cefaclor]     rash  . Erythromycin     Upset stomach   . Sulfa Antibiotics      Immunization History  Administered Date(s) Administered  . Influenza Whole 06/30/2011  . Influenza,inj,Quad PF,36+ Mos 06/05/2013  . Pneumococcal Polysaccharide-23 06/30/2011       Objective:   Physical Exam  Vitals reviewed. Constitutional: She is oriented to person, place, and time.  Eyes: Conjunctivae are normal. No scleral icterus.  Neck: Neck supple. No thyromegaly present.  Cardiovascular: Normal rate, regular rhythm and normal heart sounds.   Pulmonary/Chest: Effort normal and breath sounds normal.  Musculoskeletal: She exhibits no edema and no tenderness.  Lymphadenopathy:    She has no cervical adenopathy.  Neurological: She is alert and oriented to person, place, and time.  Skin: Skin is warm and dry.  Psychiatric: She has a normal mood and affect. Her behavior is normal. Judgment and thought content normal.      Assessment & Plan:    Erin Ramsey was seen today for medication management.  Diagnoses and associated orders for this visit:  Type II or unspecified type diabetes mellitus without mention of complication, not stated as uncontrolled - POCT HgB A1C (6.5%)  - metFORMIN (GLUCOPHAGE) 1000 MG tablet; Take 1 tablet (1,000 mg total) by mouth 2 (two) times daily with a meal. - Liraglutide (VICTOZA) 18 MG/3ML SOPN;  Inject 1.8 mg into the skin at bedtime. - Insulin Pen Needle (NOVOFINE) 32G X 6 MM MISC; Inject 1 pen into the skin at bedtime.  Essential hypertension, benign - Candesartan Cilexetil-HCTZ (ATACAND HCT) 32-25 MG TABS; Take 1 tablet by mouth daily.  Other and unspecified hyperlipidemia  Asthma, chronic - mometasone-formoterol (  DULERA) 100-5 MCG/ACT AERO; Inhale 2 puffs into the lungs 2 (two) times daily.  Menopause - estradiol (VIVELLE-DOT) 0.1 MG/24HR patch; Place 1 patch (0.1 mg total) onto the skin 2 (two) times a week.   TIME SPENT "FACE TO FACE" WITH PATIENT -  30 MINS

## 2014-01-23 NOTE — Patient Instructions (Signed)
1)  Blood Sugar - Try a combination of Victoza plus metformin or follow Dr. Francis DowseJoel Fuhrman's "The End of Diabetes".     Diabetes and Exercise Exercising regularly is important. It is not just about losing weight. It has many health benefits, such as:  Improving your overall fitness, flexibility, and endurance.  Increasing your bone density.  Helping with weight control.  Decreasing your body fat.  Increasing your muscle strength.  Reducing stress and tension.  Improving your overall health. People with diabetes who exercise gain additional benefits because exercise:  Reduces appetite.  Improves the body's use of blood sugar (glucose).  Helps lower or control blood glucose.  Decreases blood pressure.  Helps control blood lipids (such as cholesterol and triglycerides).  Improves the body's use of the hormone insulin by:  Increasing the body's insulin sensitivity.  Reducing the body's insulin needs.  Decreases the risk for heart disease because exercising:  Lowers cholesterol and triglycerides levels.  Increases the levels of good cholesterol (such as high-density lipoproteins [HDL]) in the body.  Lowers blood glucose levels. YOUR ACTIVITY PLAN  Choose an activity that you enjoy and set realistic goals. Your health care provider or diabetes educator can help you make an activity plan that works for you. You can break activities into 2 or 3 sessions throughout the day. Doing so is as good as one long session. Exercise ideas include:  Taking the dog for a walk.  Taking the stairs instead of the elevator.  Dancing to your favorite song.  Doing your favorite exercise with a friend. RECOMMENDATIONS FOR EXERCISING WITH TYPE 1 OR TYPE 2 DIABETES   Check your blood glucose before exercising. If blood glucose levels are greater than 240 mg/dL, check for urine ketones. Do not exercise if ketones are present.  Avoid injecting insulin into areas of the body that are going to  be exercised. For example, avoid injecting insulin into:  The arms when playing tennis.  The legs when jogging.  Keep a record of:  Food intake before and after you exercise.  Expected peak times of insulin action.  Blood glucose levels before and after you exercise.  The type and amount of exercise you have done.  Review your records with your health care provider. Your health care provider will help you to develop guidelines for adjusting food intake and insulin amounts before and after exercising.  If you take insulin or oral hypoglycemic agents, watch for signs and symptoms of hypoglycemia. They include:  Dizziness.  Shaking.  Sweating.  Chills.  Confusion.  Drink plenty of water while you exercise to prevent dehydration or heat stroke. Body water is lost during exercise and must be replaced.  Talk to your health care provider before starting an exercise program to make sure it is safe for you. Remember, almost any type of activity is better than none. Document Released: 11/18/2003 Document Revised: 04/30/2013 Document Reviewed: 02/04/2013 [facility] Patient Information 2014 [place], [place].

## 2014-01-28 ENCOUNTER — Other Ambulatory Visit: Payer: Self-pay | Admitting: *Deleted

## 2014-01-28 DIAGNOSIS — I10 Essential (primary) hypertension: Secondary | ICD-10-CM

## 2014-02-03 ENCOUNTER — Other Ambulatory Visit: Payer: Self-pay | Admitting: *Deleted

## 2014-02-03 DIAGNOSIS — I10 Essential (primary) hypertension: Secondary | ICD-10-CM

## 2014-02-03 MED ORDER — CANDESARTAN CILEXETIL-HCTZ 32-25 MG PO TABS: 1.0000 | ORAL_TABLET | Freq: Every day | ORAL | Status: AC

## 2014-03-09 ENCOUNTER — Other Ambulatory Visit: Payer: Self-pay | Admitting: *Deleted

## 2014-03-09 DIAGNOSIS — E119 Type 2 diabetes mellitus without complications: Secondary | ICD-10-CM

## 2014-03-09 MED ORDER — LIRAGLUTIDE 18 MG/3ML ~~LOC~~ SOPN: 1.8000 mg | PEN_INJECTOR | Freq: Every day | SUBCUTANEOUS | Status: AC

## 2014-03-18 ENCOUNTER — Encounter: Payer: Self-pay | Admitting: Family Medicine

## 2014-03-18 ENCOUNTER — Ambulatory Visit (INDEPENDENT_AMBULATORY_CARE_PROVIDER_SITE_OTHER): Payer: 59 | Admitting: Family Medicine

## 2014-03-18 VITALS — BP 131/75 | HR 75 | Resp 16 | Ht 62.0 in | Wt 160.0 lb

## 2014-03-18 DIAGNOSIS — N951 Menopausal and female climacteric states: Secondary | ICD-10-CM

## 2014-03-18 DIAGNOSIS — Z780 Asymptomatic menopausal state: Secondary | ICD-10-CM

## 2014-03-18 DIAGNOSIS — E785 Hyperlipidemia, unspecified: Secondary | ICD-10-CM

## 2014-03-18 DIAGNOSIS — IMO0001 Reserved for inherently not codable concepts without codable children: Secondary | ICD-10-CM

## 2014-03-18 DIAGNOSIS — E039 Hypothyroidism, unspecified: Secondary | ICD-10-CM

## 2014-03-18 DIAGNOSIS — E1165 Type 2 diabetes mellitus with hyperglycemia: Secondary | ICD-10-CM

## 2014-03-18 DIAGNOSIS — J309 Allergic rhinitis, unspecified: Secondary | ICD-10-CM

## 2014-03-18 MED ORDER — FLUTICASONE PROPIONATE 50 MCG/ACT NA SUSP: 1.0000 | Freq: Every day | NASAL | Status: AC

## 2014-03-18 NOTE — Progress Notes (Signed)
Subjective:    Patient ID: Erin Ramsey, female    DOB: 1961/06/03, 53 y.o.   MRN: [id number]  HPI  Erin Ramsey is here today needing to have form completed for her insurance. She has been doing well since her last visit. She has no medical complaints today but does need to have her Flonase refilled.     Review of Systems  Constitutional: Negative for activity change, appetite change and fatigue.  Cardiovascular: Negative for chest pain, palpitations and leg swelling.  Psychiatric/Behavioral: Negative for behavioral problems. The patient is not nervous/anxious.   All other systems reviewed and are negative.    Past Medical History  Diagnosis Date  . Hyperlipemia   . Hypertension   . Migraine   . Barrett's esophagus   . Colon polyps   . Esophageal stricture   . Allergic rhinitis   . Asthma   . Kidney stone   . Bladder spasms   . GERD (gastroesophageal reflux disease)   . Pneumonia 2012    3 Episodes in 2012  . Chronic sinusitis   . Anxiety   . Depression   . Type II or unspecified type diabetes mellitus without mention of complication, not stated as uncontrolled      Past Surgical History  Procedure Laterality Date  . Total vaginal hysterectomy    . Cholecystectomy    . Appendectomy    . Tonsillectomy    . 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 COPD Mother   . Diabetes type II Mother   . Heart failure Mother   . Hyperlipidemia Mother   . Brain cancer Father      Current Outpatient Prescriptions on File Prior to Visit  Medication Sig Dispense Refill   . Candesartan Cilexetil-HCTZ (ATACAND HCT) 32-25 MG TABS Take 1 tablet by mouth daily.  90 each  3  . EPIPEN 2-PAK 0.3 MG/0.3ML SOAJ injection       . esomeprazole (NEXIUM) 40 MG capsule Take 1 capsule (40 mg total) by mouth 2 (two) times daily before a meal.  60 capsule  11  . Fe Fum-FA-B Cmp-C-Zn-Mg-Mn-Cu (HEMATINIC PLUS VIT/MINERALS) 106-1 MG TABS Take 1 tablet by mouth daily.  90 each  3  . glucose blood (ACCU-CHEK AVIVA PLUS) test strip Use as directed  100 each  3  . hydrOXYzine (VISTARIL) 25 MG capsule Take 1 capsule daily as needed  30 capsule  2  . indomethacin (INDOCIN SR) 75 MG CR capsule Take 75 mg by mouth daily as needed.       . Insulin Pen Needle (NOVOFINE) 32G X 6 MM MISC Inject 1 pen into the skin at bedtime.  100 each  3  . levalbuterol (XOPENEX HFA) 45 MCG/ACT inhaler Inhale 2 puffs into the lungs every 4 to 6 hours as needed  1 Inhaler  1  . levalbuterol (XOPENEX) 1.25 MG/3ML nebulizer solution Take 1.25 mg by nebulization every 6 (  six) hours as needed.  72 mL  11  . Liraglutide (VICTOZA) 18 MG/3ML SOPN Inject 1.8 mg into the skin at bedtime.  9 pen  1  . metFORMIN (GLUCOPHAGE) 1000 MG tablet Take 1 tablet (1,000 mg total) by mouth 2 (two) times daily with a meal.  180 tablet  3  . mometasone-formoterol (DULERA) 100-5 MCG/ACT AERO Inhale 2 puffs into the lungs 2 (two) times daily.  3 Inhaler  3  . montelukast (SINGULAIR) 10 MG tablet Take 1 tablet (10 mg total) by mouth at bedtime.  90 tablet  3  . Multiple Vitamin (MULTIVITAMIN) tablet Take 1 tablet by mouth daily.        . progesterone (PROMETRIUM) 200 MG capsule Take 1 capsule (200 mg total) by mouth at bedtime.  30 capsule  11  . promethazine (PHENERGAN) 12.5 MG tablet Take 1 tablet (12.5 mg total) by mouth every 8 (eight) hours as needed for nausea.  60 tablet  2  . VENTOLIN HFA 108 (90 BASE) MCG/ACT inhaler as needed.      . Vitamin D, Ergocalciferol, (DRISDOL) 50000 UNITS CAPS capsule Take 1 capsule (50,000 Units  total) by mouth every 7 (seven) days.  12 capsule  3   No current facility-administered medications on file prior to visit.     Allergies  Allergen Reactions  . Ceclor [Cefaclor]     rash  . Erythromycin     Upset stomach   . Sulfa Antibiotics      Immunization History  Administered Date(s) Administered  . Influenza Whole 06/30/2011  . Influenza,inj,Quad PF,36+ Mos 06/05/2013  . Pneumococcal Polysaccharide-23 06/30/2011       Objective:   Physical Exam  Vitals reviewed. Constitutional: She is oriented to person, place, and time. She appears well-nourished.  Eyes: Conjunctivae are normal. No scleral icterus.  Neck: Neck supple. No thyromegaly present.  Cardiovascular: Normal rate, regular rhythm and normal heart sounds.   Pulmonary/Chest: Effort normal and breath sounds normal.  Musculoskeletal: She exhibits no edema and no tenderness.  Lymphadenopathy:    She has no cervical adenopathy.  Neurological: She is alert and oriented to person, place, and time.  Skin: Skin is warm and dry.  Psychiatric: She has a normal mood and affect. Her behavior is normal. Judgment and thought content normal.      Assessment & Plan:    Erin Ramsey was seen today for medical management of chronic issues.  Diagnoses and associated orders for this visit:  Allergic rhinitis, unspecified allergic rhinitis type - fluticasone (FLONASE) 50 MCG/ACT nasal spray; Place 1 spray into both nostrils daily.  Other and unspecified hyperlipidemia - Lipid panel  Unspecified hypothyroidism - TSH - T4, free - T3, free  Menopause - Estradiol - Estrone - DHEA  Type II or unspecified type diabetes mellitus without mention of complication, uncontrolled - Microalbumin, urine

## 2014-03-19 LAB — LIPID PANEL
Cholesterol: 159 mg/dL (ref 0–200)
HDL: 42 mg/dL (ref >39)
LDL Cholesterol: 85 mg/dL (ref 0–99)
Total CHOL/HDL Ratio: 3.8 Ratio
Triglycerides: 162 mg/dL — ABNORMAL HIGH (ref <150)
VLDL: 32 mg/dL (ref 0–40)

## 2014-03-20 LAB — MICROALBUMIN, URINE: Microalb, Ur: 0.72 mg/dL (ref 0.00–1.89)

## 2014-03-20 LAB — T3, FREE: T3, Free: 2.7 pg/mL (ref 2.3–4.2)

## 2014-03-20 LAB — T4, FREE: Free T4: 0.97 ng/dL (ref 0.80–1.80)

## 2014-03-20 LAB — TSH: TSH: 0.254 u[IU]/mL — ABNORMAL LOW (ref 0.350–4.500)

## 2014-03-24 LAB — ESTRONE: Estrone: 237 pg/mL

## 2014-03-24 LAB — DHEA: DHEA: 100 ng/dL — ABNORMAL LOW (ref 102–1185)

## 2014-03-25 LAB — ESTRADIOL, FREE
Estradiol, Free: 0.72 pg/mL
Estradiol: 37 pg/mL

## 2014-03-31 ENCOUNTER — Encounter: Payer: Self-pay | Admitting: Family Medicine

## 2014-04-01 ENCOUNTER — Encounter: Payer: Self-pay | Admitting: Family Medicine

## 2014-04-01 ENCOUNTER — Ambulatory Visit (INDEPENDENT_AMBULATORY_CARE_PROVIDER_SITE_OTHER): Payer: 59 | Admitting: Family Medicine

## 2014-04-01 VITALS — BP 117/77 | HR 76 | Resp 16 | Ht 62.0 in | Wt 160.0 lb

## 2014-04-01 DIAGNOSIS — Z780 Asymptomatic menopausal state: Secondary | ICD-10-CM

## 2014-04-01 DIAGNOSIS — N951 Menopausal and female climacteric states: Secondary | ICD-10-CM

## 2014-04-01 DIAGNOSIS — F4322 Adjustment disorder with anxiety: Secondary | ICD-10-CM

## 2014-04-01 DIAGNOSIS — R1013 Epigastric pain: Secondary | ICD-10-CM

## 2014-04-01 DIAGNOSIS — J329 Chronic sinusitis, unspecified: Secondary | ICD-10-CM

## 2014-04-01 DIAGNOSIS — J320 Chronic maxillary sinusitis: Secondary | ICD-10-CM

## 2014-04-01 MED ORDER — ESCITALOPRAM OXALATE 5 MG PO TABS: 5.0000 mg | ORAL_TABLET | Freq: Every day | ORAL | Status: AC

## 2014-04-01 MED ORDER — ESTRADIOL 0.1 MG/24HR TD PTTW: 1.0000 | MEDICATED_PATCH | TRANSDERMAL | Status: AC

## 2014-04-01 MED ORDER — AMOXICILLIN-POT CLAVULANATE 875-125 MG PO TABS: 1.0000 | ORAL_TABLET | Freq: Two times a day (BID) | ORAL | Status: AC

## 2014-04-01 MED ORDER — AZITHROMYCIN 2 G PO SUSR: 2.0000 g | Freq: Once | ORAL | Status: AC

## 2014-04-01 NOTE — Progress Notes (Signed)
Subjective:    Patient ID: Erin Ramsey, female    DOB: 1960-12-06, 53 y.o.   MRN: [id number]  HPI  Erin Ramsey is here today complaining of sinus issues including headache, drainage and sinus pressure. She has been having these symptoms about 1 week. She has been taking Advil migraine, Sudafed and Mucinex which have not helped.  We are also going over her recent lab results. She also needs to have several of her medications refilled.     Review of Systems  Constitutional: Negative for activity change, appetite change and fatigue.  HENT: Positive for congestion and sinus pressure.   Cardiovascular: Negative for chest pain, palpitations and leg swelling.  Neurological: Positive for headaches.  Psychiatric/Behavioral: Negative for behavioral problems. The patient is not nervous/anxious.   All other systems reviewed and are negative.    Past Medical History  Diagnosis Date  . Hyperlipemia   . Hypertension   . Migraine   . Barrett's esophagus   . Colon polyps   . Esophageal stricture   . Allergic rhinitis   . Asthma   . Kidney stone   . Bladder spasms   . GERD (gastroesophageal reflux disease)   . Pneumonia 2012    3 Episodes in 2012  . Chronic sinusitis   . Anxiety   . Depression   . Type II or unspecified type diabetes mellitus without mention of complication, not stated as uncontrolled      Past Surgical History  Procedure Laterality Date  . Total vaginal hysterectomy    . Cholecystectomy    . Appendectomy    . Tonsillectomy    . 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 COPD Mother   . Diabetes type II Mother   . Heart failure Mother   . Hyperlipidemia Mother   . Brain cancer Father      Current Outpatient Prescriptions on File Prior to Visit  Medication Sig Dispense Refill  . Candesartan Cilexetil-HCTZ (ATACAND HCT) 32-25 MG TABS Take 1 tablet by mouth daily.  90 each  3  . EPIPEN 2-PAK 0.3 MG/0.3ML SOAJ injection       . esomeprazole (NEXIUM) 40 MG capsule Take 1 capsule (40 mg total) by mouth 2 (two) times daily before a meal.  60 capsule  11  . Fe Fum-FA-B Cmp-C-Zn-Mg-Mn-Cu (HEMATINIC PLUS VIT/MINERALS) 106-1 MG TABS Take 1 tablet by mouth daily.  90 each  3  . fluticasone (FLONASE) 50 MCG/ACT nasal spray Place 1 spray into both nostrils daily.  16 g  3  . glucose blood (ACCU-CHEK AVIVA PLUS) test strip Use as directed  100 each  3  . hydrOXYzine (VISTARIL) 25 MG capsule Take 1 capsule daily as needed  30 capsule  2  . indomethacin (INDOCIN SR) 75 MG CR capsule Take 75 mg by mouth daily as needed.       . Insulin Pen Needle (NOVOFINE) 32G X 6 MM  MISC Inject 1 pen into the skin at bedtime.  100 each  3  . levalbuterol (XOPENEX HFA) 45 MCG/ACT inhaler Inhale 2 puffs into the lungs every 4 to 6 hours as needed  1 Inhaler  1  . levalbuterol (XOPENEX) 1.25 MG/3ML nebulizer solution Take 1.25 mg by nebulization every 6 (six) hours as needed.  72 mL  11  . Liraglutide (VICTOZA) 18 MG/3ML SOPN Inject 1.8 mg into the skin at bedtime.  9 pen  1  . metFORMIN (GLUCOPHAGE) 1000 MG tablet Take 1 tablet (1,000 mg total) by mouth 2 (two) times daily with a meal.  180 tablet  3  . mometasone-formoterol (DULERA) 100-5 MCG/ACT AERO Inhale 2 puffs into the lungs 2 (two) times daily.  3 Inhaler  3  . montelukast (SINGULAIR) 10 MG tablet Take 1 tablet (10 mg total) by mouth at bedtime.  90 tablet  3  . Multiple Vitamin (MULTIVITAMIN) tablet Take 1 tablet by mouth daily.        . progesterone (PROMETRIUM) 200 MG capsule Take 1 capsule (200 mg total) by mouth at  bedtime.  30 capsule  11  . promethazine (PHENERGAN) 12.5 MG tablet Take 1 tablet (12.5 mg total) by mouth every 8 (eight) hours as needed for nausea.  60 tablet  2  . Thyroid (NATURE-THROID) 48.75 MG TABS Take 48.75 mg by mouth daily.      . VENTOLIN HFA 108 (90 BASE) MCG/ACT inhaler as needed.      . Vitamin D, Ergocalciferol, (DRISDOL) 50000 UNITS CAPS capsule Take 1 capsule (50,000 Units total) by mouth every 7 (seven) days.  12 capsule  3   No current facility-administered medications on file prior to visit.     Allergies  Allergen Reactions  . Ceclor [Cefaclor]     rash  . Erythromycin     Upset stomach   . Sulfa Antibiotics      Immunization History  Administered Date(s) Administered  . Influenza Whole 06/30/2011  . Influenza,inj,Quad PF,36+ Mos 06/05/2013  . Pneumococcal Polysaccharide-23 06/30/2011       Objective:   Physical Exam  Nursing note and vitals reviewed. Constitutional: She appears well-nourished. No distress.  HENT:  Head: Normocephalic.  Nose: Right sinus exhibits maxillary sinus tenderness and frontal sinus tenderness. Left sinus exhibits maxillary sinus tenderness and frontal sinus tenderness.  Mouth/Throat: No oropharyngeal exudate.  Nasal turbinates are swollen L >R   Eyes: Conjunctivae are normal. Right eye exhibits no discharge. Left eye exhibits no discharge.  Neck: Neck supple.  Cardiovascular: Normal rate, regular rhythm and normal heart sounds.  Exam reveals no gallop and no friction rub.   No murmur heard. Pulmonary/Chest: Effort normal and breath sounds normal. She has no wheezes. She exhibits no tenderness.  Lymphadenopathy:    She has no cervical adenopathy.  Neurological: She is alert.  Skin: Skin is warm and dry. No rash noted.  Psychiatric: She has a normal mood and affect.       Assessment & Plan:    Erin Ramsey was seen today for sinusitis and medical management of chronic issues.  Diagnoses and associated orders for this  visit:  Unspecified sinusitis (chronic) - amoxicillin-clavulanate (AUGMENTIN) 875-125 MG per tablet; Take 1 tablet by mouth 2 (two) times daily.  Menopause - estradiol (VIVELLE-DOT) 0.1 MG/24HR patch; Place 1 patch (0.1 mg total) onto the skin 2 (two) times a week.  Adjustment disorder with anxious mood - escitalopram (LEXAPRO) 5 MG tablet; Take 1 tablet (5 mg total)  by mouth daily.  Chronic maxillary sinusitis - azithromycin (ZMAX) 2 G suspension; Take 2 g by mouth once. - methylPREDNISolone sodium succinate (SOLU-MEDROL) 125 mg/2 mL injection 125 mg; Inject 2 mLs (125 mg total) into the muscle once.  Abdominal pain, epigastric - CBC w/Diff - COMPLETE METABOLIC PANEL WITH GFR - Lipase   TIME SPENT "FACE TO FACE" WITH PATIENT -  30 MINS

## 2014-04-03 LAB — HM MAMMOGRAPHY

## 2014-04-06 ENCOUNTER — Encounter: Payer: Self-pay | Admitting: *Deleted

## 2014-04-06 DIAGNOSIS — J320 Chronic maxillary sinusitis: Secondary | ICD-10-CM

## 2014-04-06 MED ORDER — METHYLPREDNISOLONE SODIUM SUCC 125 MG IJ SOLR
125.0000 mg | Freq: Once | INTRAMUSCULAR | Status: AC
  Administered 2014-04-06: 125 mg via INTRAMUSCULAR

## 2014-04-12 DIAGNOSIS — I10 Essential (primary) hypertension: Secondary | ICD-10-CM | POA: Insufficient documentation

## 2014-04-12 DIAGNOSIS — Z780 Asymptomatic menopausal state: Secondary | ICD-10-CM | POA: Insufficient documentation

## 2014-04-15 LAB — CBC WITH DIFFERENTIAL/PLATELET
Basophils Absolute: 0 10*3/uL (ref 0.0–0.1)
Basophils Relative: 0 % (ref 0–1)
Eosinophils Absolute: 0.2 10*3/uL (ref 0.0–0.7)
Eosinophils Relative: 2 % (ref 0–5)
HCT: 40.8 % (ref 36.0–46.0)
Hemoglobin: 14.4 g/dL (ref 12.0–15.0)
Lymphocytes Relative: 27 % (ref 12–46)
Lymphs Abs: 3 10*3/uL (ref 0.7–4.0)
MCH: 30.7 pg (ref 26.0–34.0)
MCHC: 35.3 g/dL (ref 30.0–36.0)
MCV: 87 fL (ref 78.0–100.0)
Monocytes Absolute: 0.6 10*3/uL (ref 0.1–1.0)
Monocytes Relative: 5 % (ref 3–12)
Neutro Abs: 7.4 10*3/uL (ref 1.7–7.7)
Neutrophils Relative %: 66 % (ref 43–77)
Platelets: 346 10*3/uL (ref 150–400)
RBC: 4.69 MIL/uL (ref 3.87–5.11)
RDW: 13.2 % (ref 11.5–15.5)
WBC: 11.2 10*3/uL — ABNORMAL HIGH (ref 4.0–10.5)

## 2014-04-15 LAB — COMPLETE METABOLIC PANEL WITH GFR
ALT: 21 U/L (ref 0–35)
AST: 20 U/L (ref 0–37)
Albumin: 4 g/dL (ref 3.5–5.2)
Alkaline Phosphatase: 63 U/L (ref 39–117)
BUN: 7 mg/dL (ref 6–23)
CO2: 24 mEq/L (ref 19–32)
Calcium: 8.8 mg/dL (ref 8.4–10.5)
Chloride: 102 mEq/L (ref 96–112)
Creat: 0.46 mg/dL — ABNORMAL LOW (ref 0.50–1.10)
GFR, Est African American: >89 mL/min
GFR, Est Non African American: >89 mL/min
Glucose, Bld: 96 mg/dL (ref 70–99)
Potassium: 3.9 mEq/L (ref 3.5–5.3)
Sodium: 139 mEq/L (ref 135–145)
Total Bilirubin: 0.2 mg/dL (ref 0.2–1.2)
Total Protein: 6 g/dL (ref 6.0–8.3)

## 2014-04-15 LAB — LIPASE: Lipase: 36 U/L (ref 0–75)

## 2014-04-15 NOTE — Patient Instructions (Signed)
1)  We are checking a white count, liver enzymes and a lipase for ? Pancreatitis.  Add a Dexilant daily with your Nexium to help your GERD.  I am printing this handout on pancreatitis not cause I think you have it but JUST IN CASE :)     Acute Pancreatitis Acute pancreatitis is a disease in which the pancreas becomes suddenly inflamed. The pancreas is a large gland located behind your stomach. The pancreas produces enzymes that help digest food. The pancreas also releases the hormones glucagon and insulin that help regulate blood sugar. Damage to the pancreas occurs when the digestive enzymes from the pancreas are activated and begin attacking the pancreas before being released into the intestine. Most acute attacks last a couple of days and can cause serious complications. Some people become dehydrated and develop low blood pressure. In severe cases, bleeding into the pancreas can lead to shock and can be life-threatening. The lungs, heart, and kidneys may fail. CAUSES  Pancreatitis can happen to anyone. In some cases, the cause is unknown. Most cases are caused by:  Alcohol abuse.  Gallstones. Other less common causes are:  Certain medicines.  Exposure to certain chemicals.  Infection.  Damage caused by an accident (trauma).  Abdominal surgery. SYMPTOMS   Pain in the upper abdomen that may radiate to the back.  Tenderness and swelling of the abdomen.  Nausea and vomiting. DIAGNOSIS  Your caregiver will perform a physical exam. Blood and stool tests may be done to confirm the diagnosis. Imaging tests may also be done, such as X-rays, CT scans, or an ultrasound of the abdomen. TREATMENT  Treatment usually requires a stay in the hospital. Treatment may include:  Pain medicine.  Fluid replacement through an intravenous line (IV).  Placing a tube in the stomach to remove stomach contents and control vomiting.  Not eating for 3 or 4 days. This gives your pancreas a rest, because  enzymes are not being produced that can cause further damage.  Antibiotic medicines if your condition is caused by an infection.  Surgery of the pancreas or gallbladder. HOME CARE INSTRUCTIONS   Follow the diet advised by your caregiver. This may involve avoiding alcohol and decreasing the amount of fat in your diet.  Eat smaller, more frequent meals. This reduces the amount of digestive juices the pancreas produces.  Drink enough fluids to keep your urine clear or pale yellow.  Only take over-the-counter or prescription medicines as directed by your caregiver.  Avoid drinking alcohol if it caused your condition.  Do not smoke.  Get plenty of rest.  Check your blood sugar at home as directed by your caregiver.  Keep all follow-up appointments as directed by your caregiver. SEEK MEDICAL CARE IF:   You do not recover as quickly as expected.  You develop new or worsening symptoms.  You have persistent pain, weakness, or nausea.  You recover and then have another episode of pain. SEEK IMMEDIATE MEDICAL CARE IF:   You are unable to eat or keep fluids down.  Your pain becomes severe.  You have a fever or persistent symptoms for more than 2 to 3 days.  You have a fever and your symptoms suddenly get worse.  Your skin or the white part of your eyes turn yellow (jaundice).  You develop vomiting.  You feel dizzy, or you faint.  Your blood sugar is high (over 300 mg/dL). MAKE SURE YOU:   Understand these instructions.  Will watch your condition.  Will  get help right away if you are not doing well or get worse. Document Released: 08/28/2005 Document Revised: 02/27/2012 Document Reviewed: 12/07/2011 [facility] Patient Information 2015 [employer], [place]. This information is not intended to replace advice given to you by your health care provider. Make sure you discuss any questions you have with your health care provider.

## 2014-05-05 DIAGNOSIS — J309 Allergic rhinitis, unspecified: Secondary | ICD-10-CM | POA: Insufficient documentation

## 2014-05-05 DIAGNOSIS — E039 Hypothyroidism, unspecified: Secondary | ICD-10-CM | POA: Insufficient documentation

## 2014-05-15 ENCOUNTER — Other Ambulatory Visit: Payer: Self-pay | Admitting: Family Medicine

## 2014-05-15 DIAGNOSIS — J0101 Acute recurrent maxillary sinusitis: Secondary | ICD-10-CM

## 2014-05-15 MED ORDER — AZITHROMYCIN 500 MG PO TABS: 500.0000 mg | ORAL_TABLET | Freq: Every day | ORAL | Status: AC

## 2014-05-15 NOTE — Telephone Encounter (Signed)
Zorina sent me a text telling me that she has developed sinusitis symptoms.  She wanted to have a Zithromax on file in case she worsens when I am out of the office until 10/1.  (RZ)
